# Patient Record
Sex: Female | Born: 1980 | Race: Black or African American | Hispanic: No | Marital: Single | Smoking: Never smoker
Health system: Southern US, Community
[De-identification: ages and names within clinical notes are randomized; demographics above are authoritative.]

## PROBLEM LIST (undated history)

## (undated) DIAGNOSIS — A599 Trichomoniasis, unspecified: Secondary | ICD-10-CM

## (undated) DIAGNOSIS — I10 Essential (primary) hypertension: Secondary | ICD-10-CM

## (undated) DIAGNOSIS — E119 Type 2 diabetes mellitus without complications: Secondary | ICD-10-CM

## (undated) DIAGNOSIS — T7840XA Allergy, unspecified, initial encounter: Secondary | ICD-10-CM

## (undated) HISTORY — DX: Essential (primary) hypertension: I10

## (undated) HISTORY — DX: Type 2 diabetes mellitus without complications: E11.9

## (undated) HISTORY — DX: Allergy, unspecified, initial encounter: T78.40XA

## (undated) HISTORY — DX: Trichomoniasis, unspecified: A59.9

---

## 2015-08-12 ENCOUNTER — Ambulatory Visit: Payer: Self-pay | Admitting: "Endocrinology

## 2017-10-28 ENCOUNTER — Telehealth: Payer: Self-pay | Admitting: Family Medicine

## 2017-10-28 ENCOUNTER — Ambulatory Visit: Payer: Self-pay | Admitting: Family Medicine

## 2017-10-30 NOTE — Telephone Encounter (Signed)
Message was left for patient on appointment day but there has been no response.  Note filed.

## 2017-11-05 NOTE — Progress Notes (Signed)
Subjective: YQ:[id number] care, HTN, DM2, abnormal uterine bleeding HPI: Melissa Kelley is a 37 y.o. female presenting to clinic today for:  1. Hypertension Patient is that she was diagnosed around 2014.  Meds: Compliant with triamterene/HCTZ, Side effects: Increased urination. Denies headache, dizziness, visual changes, nausea, vomiting, chest pain, LE swelling, abdominal pain or shortness of breath.  2. Type 2 Diabetes:  She reports that she was diagnosed in April 2014.  Taking medication(s): Metformin 1000 mg p.o. twice daily, glipizide XL 10 mg (but notes that she was actually prescribed 5 mg most recently), Side effects: She does have some GI upset with metformin despite consistent use.  Last eye exam: About 1 year ago.  She sees Dr. Truman Hayward at [facility].  No history of retinopathy. Last foot exam: About 1 year ago.  No history of ulcers or peripheral neuropathy. Last A1c: About 1 year ago.  Unsure as to what the last A1c value was. Nephropathy screen indicated?:  Yes.  Not currently on ACE-I or ARB. Last flu, zoster and/or pneumovax: Unknown.  Awaiting previous PCPs records.  ROS: denies fever, chills, dizziness, LOC, polyuria, polydipsia, unintended weight loss/gain, foot ulcerations, numbness or tingling in extremities or chest pain.  3.  Abnormal uterine bleeding Patient notes that she has had a long-standing history of abnormal uterine bleeding.  She reports heavy menstrual cycles with clotting and discomfort.  She notes that between.  She will actually have pelvic pain.  She is concerned that she may have endometriosis or fibroid.  No family history of endometriosis or fibroid.  She would like to have this further evaluated.  She is not currently on any contraception.  She has 2 children.  Last pap ~3 years ago.  No abnormals.    Past Medical History:  Diagnosis Date  . Allergy   . Diabetes mellitus without complication ([place])   . Hypertension    History reviewed. No pertinent  surgical history. Social History   Socioeconomic History  . Marital status: Single    Spouse name: Not on file  . Number of children: Not on file  . Years of education: Not on file  . Highest education level: Not on file  Occupational History  . Not on file  Social Needs  . Financial resource strain: Not on file  . Food insecurity:    Worry: Not on file    Inability: Not on file  . Transportation needs:    Medical: Not on file    Non-medical: Not on file  Tobacco Use  . Smoking status: Never Smoker  Substance and Sexual Activity  . Alcohol use: Never    Frequency: Never  . Drug use: Never  . Sexual activity: Not on file  Lifestyle  . Physical activity:    Days per week: Not on file    Minutes per session: Not on file  . Stress: Not on file  Relationships  . Social connections:    Talks on phone: Not on file    Gets together: Not on file    Attends religious service: Not on file    Active member of club or organization: Not on file    Attends meetings of clubs or organizations: Not on file    Relationship status: Not on file  . Intimate partner violence:    Fear of current or ex partner: Not on file    Emotionally abused: Not on file    Physically abused: Not on file    Forced sexual activity: Not  on file  Other Topics Concern  . Not on file  Social History Narrative  . Not on file   Current Meds  Medication Sig  . glipiZIDE (GLUCOTROL) 5 MG tablet Take by mouth daily before breakfast.  . metFORMIN (GLUCOPHAGE) 1000 MG tablet Take 1,000 mg by mouth 2 (two) times daily with a meal.  . triamterene-hydrochlorothiazide (DYAZIDE) 37.5-25 MG capsule Take 1 capsule by mouth daily.   Family History  Problem Relation Age of Onset  . Diabetes Father    No Known Allergies  ROS: Per HPI  Objective: Office vital signs reviewed. BP 127/86   Pulse (!) 104   Temp 97.6 F (36.4 C) (Oral)   Ht '5\' 3"'  (1.6 m)   Wt 208 lb (94.3 kg)   BMI 36.85 kg/m   Physical  Examination:  General: Awake, alert, obese, No acute distress HEENT: Normal    Neck: No goiter.    Eyes: PERRLA, extraocular movement in tact, sclera white    Throat: moist mucus membranes Cardio: regular rate and rhythm, S1S2 heard, no murmurs appreciated Pulm: clear to auscultation bilaterally, no wheezes, rhonchi or rales; normal work of breathing on room air GI: Central adiposity noted. Extremities: warm, well perfused, No edema, cyanosis or clubbing; +2 pulses bilaterally MSK: normal gait and normal station Skin: dry; intact; no rashes or lesions Neuro: No focal neurologic deficits.  See monofilament exam as below.  Diabetic Foot Form - Detailed   Diabetic Foot Exam - detailed Diabetic Foot exam was performed with the following findings:  Yes 11/06/2017  4:04 PM  Visual Foot Exam completed.:  Yes  Can the patient see the bottom of their feet?:  Yes Are the shoes appropriate in style and fit?:  Yes Is there swelling or and abnormal foot shape?:  No Is there a claw toe deformity?:  Yes Is there elevated skin temparature?:  No Is there foot or ankle muscle weakness?:  No Normal Range of Motion:  Yes Pulse Foot Exam completed.:  Yes  Right posterior Tibialias:  Present Left posterior Tibialias:  Present  Right Dorsalis Pedis:  Present Left Dorsalis Pedis:  Present  Sensory Foot Exam Completed.:  Yes Semmes-Weinstein Monofilament Test R Site 1-Great Toe:  Pos L Site 1-Great Toe:  Pos       Assessment/ Plan: 37 y.o. female   1. Diabetes mellitus without complication ([place]) We will plan to refill diabetic medications pending A1c result today.  Check A1c, urine microalbumin/creatinine, CMP, lipid panel.  Plan to start Lipitor 1m.  We discussed Livalo today as an alternative as well.  Will plan to consider Victoza/ Trulicity as well. - Microalbumin / creatinine urine ratio - CMP14+EGFR - Bayer DCA Hb A1c Waived - Lipid Panel  2. Establishing care with new doctor, encounter  for ROI completed.  Will obtain records from Dr BIrene Shipperoffice  3. Hypertension associated with diabetes ([place].  Medication refilled.   - CMP14+EGFR  4. Morbid obesity (HCC) - TSH  5. Abnormal uterine bleeding (AUB) May need to refer to GYN.  Labs and imaging as below. - CBC with Differential - TSH - [place]elvis Complete; Future - [place]Transvaginal Non-OB; Future   Ashly MWindell Moulding DMcKinnon([phone]

## 2017-11-06 ENCOUNTER — Encounter: Payer: Self-pay | Admitting: Family Medicine

## 2017-11-06 ENCOUNTER — Ambulatory Visit (INDEPENDENT_AMBULATORY_CARE_PROVIDER_SITE_OTHER): Payer: Managed Care, Other (non HMO) | Admitting: Family Medicine

## 2017-11-06 VITALS — BP 127/86 | HR 104 | Temp 97.6°F | Ht 63.0 in | Wt 208.0 lb

## 2017-11-06 DIAGNOSIS — I10 Essential (primary) hypertension: Secondary | ICD-10-CM | POA: Diagnosis not present

## 2017-11-06 DIAGNOSIS — E1159 Type 2 diabetes mellitus with other circulatory complications: Secondary | ICD-10-CM | POA: Diagnosis not present

## 2017-11-06 DIAGNOSIS — E119 Type 2 diabetes mellitus without complications: Secondary | ICD-10-CM | POA: Diagnosis not present

## 2017-11-06 DIAGNOSIS — N939 Abnormal uterine and vaginal bleeding, unspecified: Secondary | ICD-10-CM

## 2017-11-06 DIAGNOSIS — Z7689 Persons encountering health services in other specified circumstances: Secondary | ICD-10-CM

## 2017-11-06 DIAGNOSIS — I152 Hypertension secondary to endocrine disorders: Secondary | ICD-10-CM | POA: Insufficient documentation

## 2017-11-06 DIAGNOSIS — Z7985 Long-term (current) use of injectable non-insulin antidiabetic drugs: Secondary | ICD-10-CM | POA: Insufficient documentation

## 2017-11-06 LAB — BAYER DCA HB A1C WAIVED: HB A1C (BAYER DCA - WAIVED): 11 % — ABNORMAL HIGH (ref <7.0)

## 2017-11-06 NOTE — Patient Instructions (Signed)
You had labs performed today.  You will be contacted with the results of the labs once they are available, usually in the next 3 days for routine lab work.  I have ordered a pelvic ultrasound.  You will be contacted to schedule this.   Abnormal Uterine Bleeding Abnormal uterine bleeding means bleeding more than usual from your uterus. It can include:  Bleeding between periods.  Bleeding after sex.  Bleeding that is heavier than normal.  Periods that last longer than usual.  Bleeding after you have stopped having your period (menopause).  There are many problems that may cause this. You should see a doctor for any kind of bleeding that is not normal. Treatment depends on the cause of the bleeding. Follow these instructions at home:  Watch your condition for any changes.  Do not use tampons, douche, or have sex, if your doctor tells you not to.  Change your pads often.  Get regular well-woman exams. Make sure they include a pelvic exam and cervical cancer screening.  Keep all follow-up visits as told by your doctor. This is important. Contact a doctor if:  The bleeding lasts more than one week.  You feel dizzy at times.  You feel like you are going to throw up (nauseous).  You throw up. Get help right away if:  You pass out.  You have to change pads every hour.  You have belly (abdominal) pain.  You have a fever.  You get sweaty.  You get weak.  You passing large blood clots from your vagina. Summary  Abnormal uterine bleeding means bleeding more than usual from your uterus.  There are many problems that may cause this. You should see a doctor for any kind of bleeding that is not normal.  Treatment depends on the cause of the bleeding. This information is not intended to replace advice given to you by your health care provider. Make sure you discuss any questions you have with your health care provider. Document Released: 04/29/2009 Document Revised:  06/26/2016 Document Reviewed: 06/26/2016 Elsevier Interactive Patient Education  2017 [employer]

## 2017-11-07 ENCOUNTER — Other Ambulatory Visit: Payer: Self-pay | Admitting: Family Medicine

## 2017-11-07 LAB — CMP14+EGFR
A/G RATIO: 1.3 (ref 1.2–2.2)
ALBUMIN: 4.3 g/dL (ref 3.5–5.5)
ALK PHOS: 85 IU/L (ref 39–117)
ALT: 21 IU/L (ref 0–32)
AST: 18 IU/L (ref 0–40)
BUN / CREAT RATIO: 12 (ref 9–23)
BUN: 9 mg/dL (ref 6–20)
Bilirubin Total: 0.3 mg/dL (ref 0.0–1.2)
CO2: 24 mmol/L (ref 20–29)
CREATININE: 0.74 mg/dL (ref 0.57–1.00)
Calcium: 9.7 mg/dL (ref 8.7–10.2)
Chloride: 95 mmol/L — ABNORMAL LOW (ref 96–106)
GFR calc Af Amer: 121 mL/min/{1.73_m2} (ref >59)
GFR calc non Af Amer: 105 mL/min/{1.73_m2} (ref >59)
GLOBULIN, TOTAL: 3.2 g/dL (ref 1.5–4.5)
Glucose: 302 mg/dL — ABNORMAL HIGH (ref 65–99)
POTASSIUM: 3.6 mmol/L (ref 3.5–5.2)
SODIUM: 135 mmol/L (ref 134–144)
Total Protein: 7.5 g/dL (ref 6.0–8.5)

## 2017-11-07 LAB — LIPID PANEL
CHOL/HDL RATIO: 3.6 ratio (ref 0.0–4.4)
Cholesterol, Total: 182 mg/dL (ref 100–199)
HDL: 50 mg/dL (ref >39)
LDL CALC: 111 mg/dL — AB (ref 0–99)
Triglycerides: 107 mg/dL (ref 0–149)
VLDL Cholesterol Cal: 21 mg/dL (ref 5–40)

## 2017-11-07 LAB — CBC WITH DIFFERENTIAL/PLATELET
Basophils Absolute: 0 10*3/uL (ref 0.0–0.2)
Basos: 0 %
EOS (ABSOLUTE): 0.1 10*3/uL (ref 0.0–0.4)
EOS: 1 %
HEMATOCRIT: 39 % (ref 34.0–46.6)
Hemoglobin: 12.9 g/dL (ref 11.1–15.9)
Immature Grans (Abs): 0 10*3/uL (ref 0.0–0.1)
Immature Granulocytes: 0 %
LYMPHS ABS: 3.5 10*3/uL — AB (ref 0.7–3.1)
Lymphs: 43 %
MCH: 30.1 pg (ref 26.6–33.0)
MCHC: 33.1 g/dL (ref 31.5–35.7)
MCV: 91 fL (ref 79–97)
MONOS ABS: 0.4 10*3/uL (ref 0.1–0.9)
Monocytes: 5 %
Neutrophils Absolute: 4.3 10*3/uL (ref 1.4–7.0)
Neutrophils: 51 %
Platelets: 339 10*3/uL (ref 150–379)
RBC: 4.29 x10E6/uL (ref 3.77–5.28)
RDW: 14.3 % (ref 12.3–15.4)
WBC: 8.3 10*3/uL (ref 3.4–10.8)

## 2017-11-07 LAB — MICROALBUMIN / CREATININE URINE RATIO
Creatinine, Urine: 55.5 mg/dL
Microalb/Creat Ratio: 6.7 mg/g creat (ref 0.0–30.0)
Microalbumin, Urine: 3.7 ug/mL

## 2017-11-07 LAB — TSH: TSH: 1.18 u[IU]/mL (ref 0.450–4.500)

## 2017-11-07 MED ORDER — TRIAMTERENE-HCTZ 37.5-25 MG PO CAPS: 1.0000 | ORAL_CAPSULE | Freq: Every day | ORAL | 12 refills | Status: DC

## 2017-11-07 MED ORDER — METFORMIN HCL 1000 MG PO TABS: 1000.0000 mg | ORAL_TABLET | Freq: Two times a day (BID) | ORAL | 12 refills | Status: DC

## 2017-11-07 MED ORDER — DULAGLUTIDE 0.75 MG/0.5ML ~~LOC~~ SOAJ: 0.7500 mg | SUBCUTANEOUS | 0 refills | Status: DC

## 2017-11-07 MED ORDER — ATORVASTATIN CALCIUM 20 MG PO TABS: 20.0000 mg | ORAL_TABLET | Freq: Every day | ORAL | 12 refills | Status: DC

## 2017-11-07 NOTE — Progress Notes (Signed)
I reviewed patient's A1c result.  We discussed options for BG control.  Will proceed with Trulicity since Metformin/ Glipizide is not keeping patient at goal.  She denies personal history of pancreatitis, MEN tumor, thyroid cancer.  Renal function reviewed & WNL.  Discontinue Glipizide.  Continue metformin 1000mg  po BID.  Start Trulicity 0.75mg  sub q per week.  Sample provided.  Patient to call in 2 weeks to let me know how she is doing on this dose.  Can plan to increase dose pending tolerance.    Meds ordered this encounter  Medications  . Dulaglutide (TRULICITY) 0.75 MG/0.5ML SOPN    Sig: Inject 0.75 mg into the skin every 7 (seven) days.    Dispense:  2 pen    Refill:  0  . metFORMIN (GLUCOPHAGE) 1000 MG tablet    Sig: Take 1 tablet (1,000 mg total) by mouth 2 (two) times daily with a meal.    Dispense:  60 tablet    Refill:  12  . triamterene-hydrochlorothiazide (DYAZIDE) 37.5-25 MG capsule    Sig: Take 1 each (1 capsule total) by mouth daily.    Dispense:  30 capsule    Refill:  12  . atorvastatin (LIPITOR) 20 MG tablet    Sig: Take 1 tablet (20 mg total) by mouth daily.    Dispense:  30 tablet    Refill:  12   Avynn Klassen M. Nadine CountsGottschalk, DO [facility]

## 2017-11-14 ENCOUNTER — Ambulatory Visit (HOSPITAL_COMMUNITY)
Admission: RE | Admit: 2017-11-14 | Discharge: 2017-11-14 | Disposition: A | Discharge status: Home / Self Care | Payer: Managed Care, Other (non HMO) | Source: Ambulatory Visit | Attending: Family Medicine | Admitting: Family Medicine

## 2017-11-14 ENCOUNTER — Telehealth: Payer: Self-pay | Admitting: Family Medicine

## 2017-11-14 ENCOUNTER — Other Ambulatory Visit: Payer: Self-pay | Admitting: Family Medicine

## 2017-11-14 DIAGNOSIS — N939 Abnormal uterine and vaginal bleeding, unspecified: Secondary | ICD-10-CM | POA: Insufficient documentation

## 2017-11-14 MED ORDER — DULAGLUTIDE 0.75 MG/0.5ML ~~LOC~~ SOAJ: 0.7500 mg | SUBCUTANEOUS | 2 refills | Status: DC

## 2017-11-14 NOTE — Telephone Encounter (Signed)
Please advise 

## 2017-11-14 NOTE — Telephone Encounter (Signed)
Spoke to patient.  She felt ill the first day that she was on the medication, citing that she had abdominal pain and nausea.  Since that time, she has been doing very well.  Blood sugars running 80-170.  No residual abdominal pain, nausea or vomiting.  She took her second dose today and is doing well.  We will continue with the 0.75 mg/week dosing for now.  We discussed that there is an option to increase the dose if she would like to.  We will reevaluate her A1c in about 3 months and increased dose pending this.

## 2017-11-18 ENCOUNTER — Other Ambulatory Visit: Payer: Self-pay | Admitting: Family Medicine

## 2017-11-18 DIAGNOSIS — N939 Abnormal uterine and vaginal bleeding, unspecified: Secondary | ICD-10-CM

## 2017-12-03 ENCOUNTER — Ambulatory Visit (INDEPENDENT_AMBULATORY_CARE_PROVIDER_SITE_OTHER): Payer: Managed Care, Other (non HMO) | Admitting: Adult Health

## 2017-12-03 ENCOUNTER — Encounter: Payer: Self-pay | Admitting: Adult Health

## 2017-12-03 VITALS — BP 128/80 | HR 98 | Ht 63.0 in | Wt 204.4 lb

## 2017-12-03 DIAGNOSIS — N921 Excessive and frequent menstruation with irregular cycle: Secondary | ICD-10-CM

## 2017-12-03 DIAGNOSIS — R109 Unspecified abdominal pain: Secondary | ICD-10-CM

## 2017-12-03 NOTE — Progress Notes (Signed)
  Subjective:     Patient ID: Melissa Kelley, female   DOB: [birth year], 37 y.o.   MRN: [id number]  HPI Melissa Kelley is a 37 year old black female in for Bleeding issues. PCP is Dr Nadine Counts.   Review of Systems +BTB periods heavy 2/5 days, changes every 2 hours +cramps Reviewed past medical,surgical, social and family history. Reviewed medications and allergies.     Objective:   Physical Exam BP 128/80 (BP Location: Left Arm, Patient Position: Sitting, Cuff Size: Small)   Pulse 98   Ht  (1.6 m)   Wt 204 lb 6.4 oz (92.7 kg)   LMP 11/17/2017 (Exact Date)   BMI 36.21 kg/m   Skin warm and dry. Neck: mid line trachea, normal thyroid, good ROM, no lymphadenopathy noted. Lungs: clear to ausculation bilaterally. Cardiovascular: regular rate and rhythm.Reviewed [place] she had done 5/2, showed EEC 15 mm, other wise normal.Will recheck [place] after next period.She declines meds to manipulate period, is ok if gets pregnant.     Assessment:     1. Menorrhagia with irregular cycle   2. Abdominal cramps       Plan:    Return 6/6 for F/U US Then see me about a week later for pap and physical Review handout on menorrhagia  Encouraged her to get blood sugars under control

## 2017-12-03 NOTE — Patient Instructions (Signed)

## 2017-12-12 ENCOUNTER — Telehealth: Payer: Self-pay | Admitting: Family Medicine

## 2017-12-12 NOTE — Telephone Encounter (Signed)
Pt aware of recommendations

## 2017-12-12 NOTE — Telephone Encounter (Signed)
We discussed her dosing on 5/2.  We will increase her dose pending her A1c in 2 months.  The higher dose of trulicity doesn't give a ton of advantages but does come with increased side effects.  I sent in refills of the 0.75mg  Trulicity already.  This should be available to her her at her Pharmacy

## 2017-12-19 ENCOUNTER — Other Ambulatory Visit: Payer: Managed Care, Other (non HMO)

## 2018-01-01 ENCOUNTER — Encounter: Payer: Self-pay | Admitting: Adult Health

## 2018-01-01 ENCOUNTER — Encounter (INDEPENDENT_AMBULATORY_CARE_PROVIDER_SITE_OTHER): Payer: Self-pay

## 2018-01-01 ENCOUNTER — Ambulatory Visit (INDEPENDENT_AMBULATORY_CARE_PROVIDER_SITE_OTHER): Payer: Managed Care, Other (non HMO) | Admitting: Adult Health

## 2018-01-01 ENCOUNTER — Other Ambulatory Visit (HOSPITAL_COMMUNITY)
Admission: RE | Admit: 2018-01-01 | Discharge: 2018-01-01 | Disposition: A | Discharge status: Home / Self Care | Payer: Managed Care, Other (non HMO) | Source: Ambulatory Visit | Attending: Adult Health | Admitting: Adult Health

## 2018-01-01 VITALS — BP 115/80 | HR 109 | Ht 62.25 in | Wt 197.5 lb

## 2018-01-01 DIAGNOSIS — Z01411 Encounter for gynecological examination (general) (routine) with abnormal findings: Secondary | ICD-10-CM

## 2018-01-01 DIAGNOSIS — N921 Excessive and frequent menstruation with irregular cycle: Secondary | ICD-10-CM | POA: Diagnosis not present

## 2018-01-01 DIAGNOSIS — Z01419 Encounter for gynecological examination (general) (routine) without abnormal findings: Secondary | ICD-10-CM | POA: Insufficient documentation

## 2018-01-01 NOTE — Progress Notes (Signed)
Patient ID: Melissa Kelley, female   DOB: 05/22/1981, 37 y.o.   MRN: [id number] History of Present Illness: Gabriela Evesikkiya is a 37 year old black female, single, G2P2 in for well woman gyn exam and pap. PCP is Dr Nadine CountsGottschalk.   Current Medications, Allergies, Past Medical History, Past Surgical History, Family History and Social History were reviewed in [facility] electronic medical record.     Review of Systems: Patient denies any headaches, hearing loss, fatigue, blurred vision, shortness of breath, chest pain, abdominal pain, problems with bowel movements, urination, or intercourse. No joint pain or mood swings. Having heavy periods and cramps at times, had had US in May with endometrial  Thickness of 15 mm, and did not get F/U US after last period.    Physical Exam:BP 115/80 (BP Location: Left Arm, Patient Position: Sitting, Cuff Size: Normal)   Pulse (!) 109   Ht 5' 2.25" (1.581 m)   Wt 197 lb 8 oz (89.6 kg)   LMP 12/13/2017   BMI 35.83 kg/m  General:  Well developed, well nourished, no acute distress Skin:  Warm and dry Neck:  Midline trachea, normal thyroid, good ROM, no lymphadenopathy Lungs; Clear to auscultation bilaterally Breast:  No dominant palpable mass, retraction, or nipple discharge, has large breasts Cardiovascular: Regular rate and rhythm Abdomen:  Soft, non tender, no hepatosplenomegaly Pelvic:  External genitalia is normal in appearance, no lesions.  The vagina is normal in appearance. Urethra has no lesions or masses. The cervix is bulbous. Pap with HPV and GC/CHL performed.  Uterus is felt to be normal size, shape, and contour.  No adnexal masses or tenderness noted.Bladder is non tender, no masses felt. Extremities/musculoskeletal:  No swelling or varicosities noted, no clubbing or cyanosis Psych:  No mood changes, alert and cooperative,seems happy PHQ 2 score 0.  Impression: 1. Encounter for gynecological examination with Papanicolaou smear of cervix   2.  Menorrhagia with irregular cycle       Plan: Will reschedule F/U US in about 3 weeks Physical in 1 year Pap in 3 if normal Mammogram at 40  Labs with PCP

## 2018-01-03 LAB — CYTOLOGY - PAP
Chlamydia: NEGATIVE
Diagnosis: NEGATIVE
HPV (WINDOPATH): NOT DETECTED
NEISSERIA GONORRHEA: NEGATIVE

## 2018-01-06 ENCOUNTER — Telehealth: Payer: Self-pay | Admitting: Adult Health

## 2018-01-06 ENCOUNTER — Encounter: Payer: Self-pay | Admitting: Adult Health

## 2018-01-06 DIAGNOSIS — A599 Trichomoniasis, unspecified: Secondary | ICD-10-CM | POA: Insufficient documentation

## 2018-01-06 HISTORY — DX: Trichomoniasis, unspecified: A59.9

## 2018-01-06 MED ORDER — METRONIDAZOLE 500 MG PO TABS: 500.0000 mg | ORAL_TABLET | Freq: Three times a day (TID) | ORAL | 0 refills | Status: DC

## 2018-01-06 NOTE — Telephone Encounter (Signed)
Pt aware that pap negative for malignancy and HPV and GC/CHL but + for trich, rx flagyl, tell partner to get treated, no sex and POC 7/5 at 1 pm

## 2018-01-17 ENCOUNTER — Ambulatory Visit (INDEPENDENT_AMBULATORY_CARE_PROVIDER_SITE_OTHER): Payer: Managed Care, Other (non HMO) | Admitting: Adult Health

## 2018-01-17 ENCOUNTER — Other Ambulatory Visit: Payer: Self-pay

## 2018-01-17 ENCOUNTER — Encounter: Payer: Self-pay | Admitting: Adult Health

## 2018-01-17 VITALS — BP 132/90 | HR 97 | Ht 63.0 in | Wt 199.0 lb

## 2018-01-17 DIAGNOSIS — Z8619 Personal history of other infectious and parasitic diseases: Secondary | ICD-10-CM | POA: Diagnosis not present

## 2018-01-17 DIAGNOSIS — Z09 Encounter for follow-up examination after completed treatment for conditions other than malignant neoplasm: Secondary | ICD-10-CM | POA: Diagnosis not present

## 2018-01-17 LAB — POCT WET PREP (WET MOUNT)
Clue Cells Wet Prep Whiff POC: NEGATIVE
TRICHOMONAS WET PREP HPF POC: ABSENT

## 2018-01-17 NOTE — Progress Notes (Signed)
  Subjective:     Patient ID: Melissa Kelley, female   DOB: 10/07/1980, 37 y.o.   MRN: [id number]  HPI Melissa Kelley is a 37 year old black female in for POC for trich on recent pap,and partner was treated.   Review of Systems  No discharge or odor Reviewed past medical,surgical, social and family history. Reviewed medications and allergies.     Objective:   Physical Exam BP 132/90 (BP Location: Right Arm, Patient Position: Sitting, Cuff Size: Normal)   Pulse 97   Ht 5\' 3"  (1.6 m)   Wt 199 lb (90.3 kg)   LMP 01/08/2018   BMI 35.25 kg/m   Skin warm and dry.Pelvic: external genitalia is normal in appearance no lesions, vagina: scant white discharge without odor,urethra has no lesions or masses noted, cervix:smooth and bulbous, uterus: normal size, shape and contour, non tender, no masses felt, adnexa: no masses or tenderness noted. Bladder is non tender and no masses felt. Wet prep: + few WBC    Assessment:      History of trich on pap, for POC    Plan:     F/U prn

## 2018-01-23 ENCOUNTER — Ambulatory Visit (INDEPENDENT_AMBULATORY_CARE_PROVIDER_SITE_OTHER): Payer: Managed Care, Other (non HMO)

## 2018-01-23 DIAGNOSIS — N921 Excessive and frequent menstruation with irregular cycle: Secondary | ICD-10-CM

## 2018-01-23 DIAGNOSIS — R109 Unspecified abdominal pain: Secondary | ICD-10-CM | POA: Diagnosis not present

## 2018-01-23 NOTE — Progress Notes (Signed)
PELVIC US TA/TV:homogeneous retroflexed uterus,wnl,EEC 15 mm,small amount of fluid w/in endometrium,normal ovaries bilat,small amount of simple cul de sac fluid,ovaries appear mobile,left adnexal pain during ultrasound

## 2018-01-24 ENCOUNTER — Telehealth: Payer: Self-pay | Admitting: Adult Health

## 2018-01-24 NOTE — Telephone Encounter (Signed)
Pt aware that uterus is normal and ovaries are normal and that EEC 15 mm, will follow for now, she declines OCs.

## 2018-02-06 ENCOUNTER — Telehealth: Payer: Self-pay | Admitting: *Deleted

## 2018-02-06 NOTE — Telephone Encounter (Signed)
Pt called regarding results of u/s done on 01/23/18. DOB verified. She states that she has been waiting on a doctor to review the report. Informed pt that Dr Alyssa GroveEures note states that the uterus and ovaries are normal. Pt verbalized understanding.

## 2018-02-07 ENCOUNTER — Telehealth: Payer: Self-pay | Admitting: *Deleted

## 2018-02-07 NOTE — Telephone Encounter (Signed)
Patient called in complaining with chest pain radiating to her arm. Patient is not having SOB. Patient advised she needs to be evaluated by the ED.

## 2018-02-10 ENCOUNTER — Ambulatory Visit (INDEPENDENT_AMBULATORY_CARE_PROVIDER_SITE_OTHER): Payer: Managed Care, Other (non HMO) | Admitting: Family Medicine

## 2018-02-10 ENCOUNTER — Encounter: Payer: Self-pay | Admitting: Family Medicine

## 2018-02-10 VITALS — BP 126/86 | HR 107 | Temp 97.2°F | Ht 63.0 in | Wt 195.0 lb

## 2018-02-10 DIAGNOSIS — E1169 Type 2 diabetes mellitus with other specified complication: Secondary | ICD-10-CM | POA: Diagnosis not present

## 2018-02-10 DIAGNOSIS — E119 Type 2 diabetes mellitus without complications: Secondary | ICD-10-CM | POA: Diagnosis not present

## 2018-02-10 DIAGNOSIS — E785 Hyperlipidemia, unspecified: Secondary | ICD-10-CM

## 2018-02-10 DIAGNOSIS — E1159 Type 2 diabetes mellitus with other circulatory complications: Secondary | ICD-10-CM

## 2018-02-10 DIAGNOSIS — I10 Essential (primary) hypertension: Secondary | ICD-10-CM

## 2018-02-10 DIAGNOSIS — I152 Hypertension secondary to endocrine disorders: Secondary | ICD-10-CM

## 2018-02-10 LAB — BAYER DCA HB A1C WAIVED: HB A1C (BAYER DCA - WAIVED): 6.8 % (ref <7.0)

## 2018-02-10 MED ORDER — SITAGLIPTIN PHOSPHATE 100 MG PO TABS: 100.0000 mg | ORAL_TABLET | Freq: Every day | ORAL | 2 refills | Status: DC

## 2018-02-10 NOTE — Patient Instructions (Addendum)
Schedule your diabetic eye exam w/ Dr Conley RollsLe.  Continue the atorvastatin for your cholesterol.  Continue Triamterene/HCTZ for your blood pressure.  It was recommended that you have the Pneumonia and Tetanus vaccines today but you declined.  Your diabetes causes decreased immunity.  Please feel free to contact our office any time to have these life saving vaccines administered.  Your A1c was well controlled today.  Continue the Metformin.  We have discontinued the Trulicty because of side effects.  I have prescribed you Januvia to start.  Start this instead of your next Trulicity dose on Tuesday.  This is available in a combination pill.  When you are getting done with the Metformin, call me and I can send in the combo pill.  Follow up in 3 months for diabetes or sooner if needed.   Diabetes Mellitus and Nutrition When you have diabetes (diabetes mellitus), it is very important to have healthy eating habits because your blood sugar (glucose) levels are greatly affected by what you eat and drink. Eating healthy foods in the appropriate amounts, at about the same times every day, can help you:  Control your blood glucose.  Lower your risk of heart disease.  Improve your blood pressure.  Reach or maintain a healthy weight.  Every person with diabetes is different, and each person has different needs for a meal plan. Your health care provider may recommend that you work with a diet and nutrition specialist (dietitian) to make a meal plan that is best for you. Your meal plan may vary depending on factors such as:  The calories you need.  The medicines you take.  Your weight.  Your blood glucose, blood pressure, and cholesterol levels.  Your activity level.  Other health conditions you have, such as heart or kidney disease.  How do carbohydrates affect me? Carbohydrates affect your blood glucose level more than any other type of food. Eating carbohydrates naturally increases the amount of  glucose in your blood. Carbohydrate counting is a method for keeping track of how many carbohydrates you eat. Counting carbohydrates is important to keep your blood glucose at a healthy level, especially if you use insulin or take certain oral diabetes medicines. It is important to know how many carbohydrates you can safely have in each meal. This is different for every person. Your dietitian can help you calculate how many carbohydrates you should have at each meal and for snack. Foods that contain carbohydrates include:  Bread, cereal, rice, pasta, and crackers.  Potatoes and corn.  Peas, beans, and lentils.  Milk and yogurt.  Fruit and juice.  Desserts, such as cakes, cookies, ice cream, and candy.  How does alcohol affect me? Alcohol can cause a sudden decrease in blood glucose (hypoglycemia), especially if you use insulin or take certain oral diabetes medicines. Hypoglycemia can be a life-threatening condition. Symptoms of hypoglycemia (sleepiness, dizziness, and confusion) are similar to symptoms of having too much alcohol. If your health care provider says that alcohol is safe for you, follow these guidelines:  Limit alcohol intake to no more than 1 drink per day for nonpregnant women and 2 drinks per day for men. One drink equals 12 oz of beer, 5 oz of wine, or 1 oz of hard liquor.  Do not drink on an empty stomach.  Keep yourself hydrated with water, diet soda, or unsweetened iced tea.  Keep in mind that regular soda, juice, and other mixers may contain a lot of sugar and must be counted as  carbohydrates.  What are tips for following this plan? Reading food labels  Start by checking the serving size on the label. The amount of calories, carbohydrates, fats, and other nutrients listed on the label are based on one serving of the food. Many foods contain more than one serving per package.  Check the total grams (g) of carbohydrates in one serving. You can calculate the number  of servings of carbohydrates in one serving by dividing the total carbohydrates by 15. For example, if a food has 30 g of total carbohydrates, it would be equal to 2 servings of carbohydrates.  Check the number of grams (g) of saturated and trans fats in one serving. Choose foods that have low or no amount of these fats.  Check the number of milligrams (mg) of sodium in one serving. Most people should limit total sodium intake to less than 2,300 mg per day.  Always check the nutrition information of foods labeled as "low-fat" or "nonfat". These foods may be higher in added sugar or refined carbohydrates and should be avoided.  Talk to your dietitian to identify your daily goals for nutrients listed on the label. Shopping  Avoid buying canned, premade, or processed foods. These foods tend to be high in fat, sodium, and added sugar.  Shop around the outside edge of the grocery store. This includes fresh fruits and vegetables, bulk grains, fresh meats, and fresh dairy. Cooking  Use low-heat cooking methods, such as baking, instead of high-heat cooking methods like deep frying.  Cook using healthy oils, such as olive, canola, or sunflower oil.  Avoid cooking with butter, cream, or high-fat meats. Meal planning  Eat meals and snacks regularly, preferably at the same times every day. Avoid going long periods of time without eating.  Eat foods high in fiber, such as fresh fruits, vegetables, beans, and whole grains. Talk to your dietitian about how many servings of carbohydrates you can eat at each meal.  Eat 4-6 ounces of lean protein each day, such as lean meat, chicken, fish, eggs, or tofu. 1 ounce is equal to 1 ounce of meat, chicken, or fish, 1 egg, or 1/4 cup of tofu.  Eat some foods each day that contain healthy fats, such as avocado, nuts, seeds, and fish. Lifestyle   Check your blood glucose regularly.  Exercise at least 30 minutes 5 or more days each week, or as told by your  health care provider.  Take medicines as told by your health care provider.  Do not use any products that contain nicotine or tobacco, such as cigarettes and e-cigarettes. If you need help quitting, ask your health care provider.  Work with a Veterinary surgeon or diabetes educator to identify strategies to manage stress and any emotional and social challenges. What are some questions to ask my health care provider?  Do I need to meet with a diabetes educator?  Do I need to meet with a dietitian?  What number can I call if I have questions?  When are the best times to check my blood glucose? Where to find more information:  American Diabetes Association: diabetes.org/food-and-fitness/food  Academy of Nutrition and Dietetics: https://www.vargas.com/  [employer] of Diabetes and Digestive and Kidney Diseases (NIH): FindJewelers.cz Summary  A healthy meal plan will help you control your blood glucose and maintain a healthy lifestyle.  Working with a diet and nutrition specialist (dietitian) can help you make a meal plan that is best for you.  Keep in mind that carbohydrates and alcohol  have immediate effects on your blood glucose levels. It is important to count carbohydrates and to use alcohol carefully. This information is not intended to replace advice given to you by your health care provider. Make sure you discuss any questions you have with your health care provider. Document Released: 03/29/2005 Document Revised: 08/06/2016 Document Reviewed: 08/06/2016 Elsevier Interactive Patient Education  [employer].

## 2018-02-10 NOTE — Progress Notes (Signed)
Subjective: CC: F/u DM2 HPI: Melissa Kelley is a 37 y.o. female presenting to clinic today for:  1.  Type 2 Diabetes/ HLD/ HTN:  History: Dx 2014. [facility]  No hx retinopathy or DM foot ulcers.  Patient was started on Trulicity 0.75mg  sub-q/ week at last visit.  She reports compliance w. Metformin.  She was to discontinue the Glipizide.  Lipitor 20mg  daily also added.  Compliant w/ Dyazide.  She reports significant side effects from Trulicity.  She notes nausea with vomiting several times per week following meals.  She reports decreased food intake secondary to nausea.  She notes several hypoglycemic episodes into the 50s and 60s.  Blood sugar high has been no more than 150.  She has had a leave work because of side effects a few times now.  [facility] No retinopathy. Last foot exam: 11/06/2017 Last A1c: 11.0 on 11/06/2017 Nephropathy screen indicated?: done 11/06/2017 Last flu, zoster and/or pneumovax: needs PNA/ TDap but declines.  ROS: denies fever, chills, dizziness, LOC, polyuria, polydipsia, unintended weight loss/gain, foot ulcerations, numbness or tingling in extremities or chest pain.   Past Medical History:  Diagnosis Date  . Allergy   . Diabetes mellitus without complication (HCC)   . Hypertension   . Trichimoniasis 01/06/2018   Treated 6/24, POC ____   No past surgical history on file. Social History   Socioeconomic History  . Marital status: Single    Spouse name: Not on file  . Number of children: Not on file  . Years of education: Not on file  . Highest education level: Not on file  Occupational History  . Not on file  Social Needs  . Financial resource strain: Not on file  . Food insecurity:    Worry: Not on file    Inability: Not on file  . Transportation needs:    Medical: Not on file    Non-medical: Not on file  Tobacco Use  . Smoking status: Never Smoker  . Smokeless tobacco: Never Used  Substance and Sexual Activity    . Alcohol use: Yes    Frequency: Never    Comment: social   . Drug use: Never  . Sexual activity: Yes    Birth control/protection: None  Lifestyle  . Physical activity:    Days per week: Not on file    Minutes per session: Not on file  . Stress: Not on file  Relationships  . Social connections:    Talks on phone: Not on file    Gets together: Not on file    Attends religious service: Not on file    Active member of club or organization: Not on file    Attends meetings of clubs or organizations: Not on file    Relationship status: Not on file  . Intimate partner violence:    Fear of current or ex partner: Not on file    Emotionally abused: Not on file    Physically abused: Not on file    Forced sexual activity: Not on file  Other Topics Concern  . Not on file  Social History Narrative  . Not on file   Current Meds  Medication Sig  . atorvastatin (LIPITOR) 20 MG tablet Take 1 tablet (20 mg total) by mouth daily.  . Dulaglutide (TRULICITY) 0.75 MG/0.5ML SOPN Inject 0.75 mg into the skin every 7 (seven) days.  . metFORMIN (GLUCOPHAGE) 1000 MG tablet Take 1 tablet (1,000 mg total)  by mouth 2 (two) times daily with a meal.  . triamterene-hydrochlorothiazide (DYAZIDE) 37.5-25 MG capsule Take 1 each (1 capsule total) by mouth daily.   Family History  Problem Relation Age of Onset  . Diabetes Father   . Pneumonia Paternal Grandfather   . Lung cancer Maternal Grandmother   . Pneumonia Maternal Grandfather    No Known Allergies  ROS: Per HPI  Objective: Office vital signs reviewed. BP 126/86   Pulse (!) 107   Temp (!) 97.2 F (36.2 C) (Oral)   Ht 5\' 3"  (1.6 m)   Wt 195 lb (88.5 kg)   BMI 34.54 kg/m   Physical Examination:  General: Awake, alert, obese, No acute distress HEENT: Normal    Eyes: PERRLA, extraocular movement in tact, sclera white    Throat: moist mucus membranes Cardio: regular rate Pulm:  normal work of breathing on room air GI: Central adiposity  present. Extremities: warm, well perfused, No edema, cyanosis or clubbing; +2 pulses bilaterally MSK: normal gait and normal station   Assessment/ Plan: 37 y.o. female   1. Diabetes mellitus without complication (HCC) A1c has greatly improved since last visit and was 6.8 today.  She is down 13 pounds from previous check.  However, she is having quite a bit of side effects from Trulicity.  These seem to be more than expected.  For this reason, I have instructed her to discontinue Trulicity.  We will start Januvia at therapeutic 100 mg daily in addition to the metformin that she is taking twice daily.  We will plan to recheck A1c in 3 months.  She is to continue monitoring blood sugar daily as directed.  She will schedule diabetic eye exam with Dr. Conley Rolls. - [facility] Hb A1c Waived  2. Hypertension associated with diabetes (HCC) Blood pressure well controlled with current regimen.  No changes made.  3. Hyperlipidemia associated with type 2 diabetes mellitus (HCC) Tolerating Lipitor without difficulty.  4. Morbid obesity (HCC) Down 13 pounds.  Continue diet modification and increasing physical activity.  Discontinue Trulicity as above.   [name], DO [facility] [phone]

## 2018-05-01 ENCOUNTER — Telehealth: Payer: Self-pay | Admitting: Family Medicine

## 2018-05-01 NOTE — Telephone Encounter (Signed)
Please have her come in to be seen to discuss diabetes regimen further.  She is due for recheck.

## 2018-05-01 NOTE — Telephone Encounter (Signed)
Patient aware and appointment given.  

## 2018-05-06 ENCOUNTER — Encounter: Payer: Self-pay | Admitting: *Deleted

## 2018-05-06 ENCOUNTER — Ambulatory Visit (INDEPENDENT_AMBULATORY_CARE_PROVIDER_SITE_OTHER): Payer: Managed Care, Other (non HMO) | Admitting: Family Medicine

## 2018-05-06 ENCOUNTER — Encounter: Payer: Self-pay | Admitting: Family Medicine

## 2018-05-06 VITALS — BP 134/96 | HR 101 | Temp 97.6°F | Ht 63.0 in | Wt 209.0 lb

## 2018-05-06 DIAGNOSIS — E1169 Type 2 diabetes mellitus with other specified complication: Secondary | ICD-10-CM | POA: Diagnosis not present

## 2018-05-06 DIAGNOSIS — I10 Essential (primary) hypertension: Secondary | ICD-10-CM

## 2018-05-06 DIAGNOSIS — E1159 Type 2 diabetes mellitus with other circulatory complications: Secondary | ICD-10-CM

## 2018-05-06 DIAGNOSIS — E119 Type 2 diabetes mellitus without complications: Secondary | ICD-10-CM

## 2018-05-06 DIAGNOSIS — E785 Hyperlipidemia, unspecified: Secondary | ICD-10-CM

## 2018-05-06 LAB — BAYER DCA HB A1C WAIVED: HB A1C (BAYER DCA - WAIVED): 10 % — ABNORMAL HIGH (ref <7.0)

## 2018-05-06 MED ORDER — SITAGLIP PHOS-METFORMIN HCL ER 100-1000 MG PO TB24: 1.0000 | ORAL_TABLET | Freq: Every day | ORAL | 12 refills | Status: DC

## 2018-05-06 NOTE — Patient Instructions (Signed)
Your A1c was 10 today.  I have prescribed the Januvia in COMBO w/ the Metformin.  You can take this medication as a single pill instead of 2 separate pills.  I have also given you a copay sheet, you have to call to activate it and bring it to the pharmacy.  See me in 3 months for recheck.  Get you diabetic eye exam done w/ Dr Conley Rolls.

## 2018-05-06 NOTE — Progress Notes (Signed)
Subjective: CC: F/u DM2 HPI: Melissa Kelley is a 37 y.o. female presenting to clinic today for:  1.  Type 2 Diabetes/ HLD/ HTN:  History: Dx 2014. Sees Dr Conley Rolls at [facility].  No hx retinopathy or DM foot ulcers.   At last visit, she was reporting significant side effects from Trulicity, including nausea, vomiting and abdominal pain. Patient was transitioned from Trulicity 0.75 mg subcu weekly to Januvia 100 mg daily.  She had also noted several hypoglycemic episodes into the 50-60.  Side effects have resulted in her having to miss work several days. She was continued on metformin, Lipitor and Dyazide.  She never started the Januvia because she was unaware that it was available to her at the pharmacy.  She is only been taking the metformin.  BG 200-350.  Last eye exam: Dr Conley Rolls at [facility]. No retinopathy. She has not scheduled this yet. Last foot exam: 11/06/2017 Last A1c: 6.8 on 01/2018 Nephropathy screen indicated?: done 11/06/2017 Last flu, zoster and/or pneumovax: needs PNA/ TDap but declines.  ROS: She reports fatigue despite adequate hours of sleep. She reports polyuria, polydipsia.  No unintended weight loss/gain, foot ulcerations, numbness or tingling in extremities or chest pain.   Past Medical History:  Diagnosis Date  . Allergy   . Diabetes mellitus without complication (HCC)   . Hypertension   . Trichimoniasis 01/06/2018   Treated 6/24, POC ____   No past surgical history on file. Social History   Socioeconomic History  . Marital status: Single    Spouse name: Not on file  . Number of children: Not on file  . Years of education: Not on file  . Highest education level: Not on file  Occupational History  . Not on file  Social Needs  . Financial resource strain: Not on file  . Food insecurity:    Worry: Not on file    Inability: Not on file  . Transportation needs:    Medical: Not on file    Non-medical: Not on file  Tobacco Use  . Smoking status: Never Smoker  . Smokeless  tobacco: Never Used  Substance and Sexual Activity  . Alcohol use: Yes    Frequency: Never    Comment: social   . Drug use: Never  . Sexual activity: Yes    Birth control/protection: None  Lifestyle  . Physical activity:    Days per week: Not on file    Minutes per session: Not on file  . Stress: Not on file  Relationships  . Social connections:    Talks on phone: Not on file    Gets together: Not on file    Attends religious service: Not on file    Active member of club or organization: Not on file    Attends meetings of clubs or organizations: Not on file    Relationship status: Not on file  . Intimate partner violence:    Fear of current or ex partner: Not on file    Emotionally abused: Not on file    Physically abused: Not on file    Forced sexual activity: Not on file  Other Topics Concern  . Not on file  Social History Narrative  . Not on file   No outpatient medications have been marked as taking for the 05/06/18 encounter (Office Visit) with [name], DO.   Family History  Problem Relation Age of Onset  . Diabetes Father   . Pneumonia Paternal Grandfather   . Lung cancer  Maternal Grandmother   . Pneumonia Maternal Grandfather    No Known Allergies  ROS: Per HPI  Objective: Office vital signs reviewed. BP (!) 134/96   Pulse (!) 101   Temp 97.6 F (36.4 C) (Oral)   Ht 5\' 3"  (1.6 m)   Wt 209 lb (94.8 kg)   BMI 37.02 kg/m   Physical Examination:  General: Awake, alert, obese, No acute distress HEENT: Normal    Eyes: extraocular movement in tact, sclera white    Throat: moist mucus membranes Cardio: Regular rate and rhythm.  S1-S2 heard.  No murmurs. Pulm: Clear to auscultation bilaterally.  Normal work of breathing on room air GI: Central adiposity MSK: normal gait and normal station   Assessment/ Plan: 37 y.o. female   1. Diabetes mellitus without complication (HCC) A1c not at goal today.  It was 10, up from 6.8.  I suspect that  high blood sugars are why she is feeling chronically fatigued.  If symptoms are persistent despite adequate blood sugar control, should consider sleep study to look for obesity hypoventilation syndrome. I contacted [facility] to ask about why the Januvia was not filled.  I was told that it was actually received but patient never picked it up.  Her copayment was $ 80.  I gave patient a Januvia coupon today and actually replaced her medication with Janumet and directed her to discontinue metformin since this will be in combination now.  I instructed her to call and activate the coupon and follow-up in 3 months for recheck.  She will continue to monitor blood pressures daily and schedule diabetic eye exam. - [facility] Hb A1c Waived  2. Hypertension associated with diabetes (HCC) Not at goal.  Possibly related to feeling poorly today.  We will recheck this in 3 months.  If needed, we will plan to increase antihypertensive.  3. Hyperlipidemia associated with type 2 diabetes mellitus (HCC) Continue statin.  Meds ordered this encounter  Medications  . SitaGLIPtin-MetFORMIN HCl (JANUMET XR) [phone] MG TB24    Sig: Take 1 tablet by mouth daily.    Dispense:  30 tablet    Refill:  12   Medications Discontinued During This Encounter  Medication Reason  . sitaGLIPtin (JANUVIA) 100 MG tablet Error  . metFORMIN (GLUCOPHAGE) 1000 MG tablet      [name], DO [facility] [phone]

## 2018-05-07 LAB — HM DIABETES EYE EXAM

## 2018-06-06 ENCOUNTER — Ambulatory Visit: Payer: Managed Care, Other (non HMO) | Admitting: Family Medicine

## 2018-06-06 ENCOUNTER — Ambulatory Visit (INDEPENDENT_AMBULATORY_CARE_PROVIDER_SITE_OTHER): Payer: Managed Care, Other (non HMO) | Admitting: Family Medicine

## 2018-06-06 ENCOUNTER — Encounter: Payer: Self-pay | Admitting: Family Medicine

## 2018-06-06 VITALS — BP 122/81 | HR 99 | Temp 97.9°F | Ht 63.0 in | Wt 209.0 lb

## 2018-06-06 DIAGNOSIS — E11628 Type 2 diabetes mellitus with other skin complications: Secondary | ICD-10-CM

## 2018-06-06 DIAGNOSIS — L84 Corns and callosities: Secondary | ICD-10-CM

## 2018-06-06 MED ORDER — DOXYCYCLINE HYCLATE 100 MG PO TABS: 100.0000 mg | ORAL_TABLET | Freq: Two times a day (BID) | ORAL | 0 refills | Status: DC

## 2018-06-06 NOTE — Patient Instructions (Signed)
Corns and Calluses Corns are small areas of thickened skin that occur on the top, sides, or tip of a toe. They contain a cone-shaped core with a point that can press on a nerve below. This causes pain. Calluses are areas of thickened skin that can occur anywhere on the body including hands, fingers, palms, soles of the feet, and heels.Calluses are usually larger than corns. What are the causes? Corns and calluses are caused by rubbing (friction) or pressure, such as from shoes that are too tight or do not fit properly. What increases the risk? Corns are more likely to develop in people who have toe deformities, such as hammer toes. Since calluses can occur with friction to any area of the skin, calluses are more likely to develop in people who:  Work with their hands.  Wear shoes that fit poorly, shoes that are too tight, or shoes that are high-heeled.  Have toes deformities.  What are the signs or symptoms? Symptoms of a corn or callus include:  A hard growth on the skin.  Pain or tenderness under the skin.  Redness and swelling.  Increased discomfort while wearing tight-fitting shoes.  How is this diagnosed? Corns and calluses may be diagnosed with a medical history and physical exam. How is this treated? Corns and calluses may be treated with:  Removing the cause of the friction or pressure. This may include: ? Changing your shoes. ? Wearing shoe inserts (orthotics) or other protective layers in your shoes, such as a corn pad. ? Wearing gloves.  Medicines to help soften skin in the hardened, thickened areas.  Reducing the size of the corn or callus by removing the dead layers of skin.  Antibiotic medicines to treat infection.  Surgery, if a toe deformity is the cause.  Follow these instructions at home:  Take medicines only as directed by your health care provider.  If you were prescribed an antibiotic, finish all of it even if you start to feel better.  Wear  shoes that fit well. Avoid wearing high-heeled shoes and shoes that are too tight or too loose.  Wear any padding, protective layers, gloves, or orthotics as directed by your health care provider.  Soak your hands or feet and then use a file or pumice stone to soften your corn or callus. Do this as directed by your health care provider.  Check your corn or callus every day for signs of infection. Watch for: ? Redness, swelling, or pain. ? Fluid, blood, or pus. Contact a health care provider if:  Your symptoms do not improve with treatment.  You have increased redness, swelling, or pain at the site of your corn or callus.  You have fluid, blood, or pus coming from your corn or callus.  You have new symptoms. This information is not intended to replace advice given to you by your health care provider. Make sure you discuss any questions you have with your health care provider. Document Released: 04/07/2004 Document Revised: 01/20/2016 Document Reviewed: 06/28/2014 Elsevier Interactive Patient Education  2018 Elsevier Inc[employer]  

## 2018-06-06 NOTE — Progress Notes (Signed)
Subjective: CC: Callus PCP: Raliegh IpGottschalk,  M, DO XBJ:[id number]:Melissa Kelley is a 37 y.o. female presenting to clinic today for:  1. Callus Patient reports development of callus on right plantar aspect of the foot.  She notes that she had a palpable knot that turned into the skin drying and sloughing off.  She tried a Ped egg to try and get the skin off but then discontinued use after she found out diabetic should not use these.  She does report some pain with certain footwear.  She has been keeping her feet protected.  She notes improvement in sugars since starting the Januvia.  She reports increased energy as well with control of blood sugars.  No fevers, purulence, erythema.   ROS: Per HPI  No Known Allergies Past Medical History:  Diagnosis Date  . Allergy   . Diabetes mellitus without complication (HCC)   . Hypertension   . Trichimoniasis 01/06/2018   Treated 6/24, POC ____    Current Outpatient Medications:  .  atorvastatin (LIPITOR) 20 MG tablet, Take 1 tablet (20 mg total) by mouth daily., Disp: 30 tablet, Rfl: 12 .  SitaGLIPtin-MetFORMIN HCl (JANUMET XR) [phone] MG TB24, Take 1 tablet by mouth daily., Disp: 30 tablet, Rfl: 12 .  triamterene-hydrochlorothiazide (DYAZIDE) 37.5-25 MG capsule, Take 1 each (1 capsule total) by mouth daily., Disp: 30 capsule, Rfl: 12 Social History   Socioeconomic History  . Marital status: Single    Spouse name: Not on file  . Number of children: Not on file  . Years of education: Not on file  . Highest education level: Not on file  Occupational History  . Not on file  Social Needs  . Financial resource strain: Not on file  . Food insecurity:    Worry: Not on file    Inability: Not on file  . Transportation needs:    Medical: Not on file    Non-medical: Not on file  Tobacco Use  . Smoking status: Never Smoker  . Smokeless tobacco: Never Used  Substance and Sexual Activity  . Alcohol use: Yes    Frequency: Never    Comment: social   .  Drug use: Never  . Sexual activity: Yes    Birth control/protection: None  Lifestyle  . Physical activity:    Days per week: Not on file    Minutes per session: Not on file  . Stress: Not on file  Relationships  . Social connections:    Talks on phone: Not on file    Gets together: Not on file    Attends religious service: Not on file    Active member of club or organization: Not on file    Attends meetings of clubs or organizations: Not on file    Relationship status: Not on file  . Intimate partner violence:    Fear of current or ex partner: Not on file    Emotionally abused: Not on file    Physically abused: Not on file    Forced sexual activity: Not on file  Other Topics Concern  . Not on file  Social History Narrative  . Not on file   Family History  Problem Relation Age of Onset  . Diabetes Father   . Pneumonia Paternal Grandfather   . Lung cancer Maternal Grandmother   . Pneumonia Maternal Grandfather     Objective: Office vital signs reviewed. BP 122/81   Pulse 99   Temp 97.9 F (36.6 C) (Oral)   Ht 5\' 3"  (  1.6 m)   Wt 209 lb (94.8 kg)   LMP 05/14/2018   BMI 37.02 kg/m   Physical Examination:  General: Awake, alert, well nourished, well appearing. No acute distress Extremities: warm, well perfused, No edema, cyanosis or clubbing; +2 pulses bilaterally  Right foot; half dollar sized area of sloughing skin.  There is no associated erythema, exudate or bleeding.  No increased warmth.  There is a palpable callus that is roughly pea-sized at the apex of the second metatarsal.  Assessment/ Plan: 37 y.o. female   1. Type 2 diabetes mellitus with pressure callus (HCC) Skin is currently sloughing off.  I do question if this started out as a blister.  She has a palpable callus at the apex of the second metatarsal.  No evidence of secondary infection but given history of diabetes and upcoming holiday I have given her written prescription for doxycycline to use twice  daily should she develop any worrisome symptoms or signs of infection.  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  She will follow-up PRN.  We discussed that if she does need to use the medication, I would like her to contact me as this would be an indication to obtain diabetic shoes.   No orders of the defined types were placed in this encounter.  Meds ordered this encounter  Medications  . doxycycline (VIBRA-TABS) 100 MG tablet    Sig: Take 1 tablet (100 mg total) by mouth 2 (two) times daily.    Dispense:  20 tablet    Refill:  0      Hulen Skains, DO [facility] [phone]

## 2018-08-06 ENCOUNTER — Ambulatory Visit: Payer: Managed Care, Other (non HMO) | Admitting: Family Medicine

## 2018-09-09 ENCOUNTER — Ambulatory Visit (INDEPENDENT_AMBULATORY_CARE_PROVIDER_SITE_OTHER): Payer: Managed Care, Other (non HMO) | Admitting: Family Medicine

## 2018-09-09 ENCOUNTER — Encounter: Payer: Self-pay | Admitting: Family Medicine

## 2018-09-09 VITALS — BP 148/88 | HR 102 | Temp 97.2°F | Ht 63.0 in | Wt 211.0 lb

## 2018-09-09 DIAGNOSIS — S46811A Strain of other muscles, fascia and tendons at shoulder and upper arm level, right arm, initial encounter: Secondary | ICD-10-CM | POA: Diagnosis not present

## 2018-09-09 MED ORDER — CYCLOBENZAPRINE HCL 5 MG PO TABS: 5.0000 mg | ORAL_TABLET | Freq: Three times a day (TID) | ORAL | 0 refills | Status: AC | PRN

## 2018-09-09 MED ORDER — NAPROXEN 500 MG PO TABS: 500.0000 mg | ORAL_TABLET | Freq: Two times a day (BID) | ORAL | 0 refills | Status: AC

## 2018-09-09 NOTE — Progress Notes (Signed)
Subjective:  Patient ID: Melissa Kelley, female    DOB: 09/10/1980, 38 y.o.   MRN: [id number]  Chief Complaint:  Pain radiating from back of head into right shoulder and arm (symptoms began a week ago)   HPI: Melissa Fetchikkiya Victoria is a 38 y.o. female presenting on 09/09/2018 for Pain radiating from back of head into right shoulder and arm (symptoms began a week ago)  Pt presents today with complaints of right neck and right shoulder pain. Pt states this started last Saturday and is not getting better. States the pain is stiff and shooting at times. States 7/10 at worst. States she has tried lidocaine patches and aleve with some relief of symptoms. The pain is worse with lifting the arm and turing head to the left. Pt denies loss of function, weakness, injury, or numbness. States it is difficult to put her bra on due to the stiffness.   Relevant past medical, surgical, family, and social history reviewed and updated as indicated.  Allergies and medications reviewed and updated.   Past Medical History:  Diagnosis Date  . Allergy   . Diabetes mellitus without complication (HCC)   . Hypertension   . Trichimoniasis 01/06/2018   Treated 6/24, POC ____    History reviewed. No pertinent surgical history.  Social History   Socioeconomic History  . Marital status: Single    Spouse name: Not on file  . Number of children: Not on file  . Years of education: Not on file  . Highest education level: Not on file  Occupational History  . Not on file  Social Needs  . Financial resource strain: Not on file  . Food insecurity:    Worry: Not on file    Inability: Not on file  . Transportation needs:    Medical: Not on file    Non-medical: Not on file  Tobacco Use  . Smoking status: Never Smoker  . Smokeless tobacco: Never Used  Substance and Sexual Activity  . Alcohol use: Yes    Frequency: Never    Comment: social   . Drug use: Never  . Sexual activity: Yes    Birth control/protection: None    Lifestyle  . Physical activity:    Days per week: Not on file    Minutes per session: Not on file  . Stress: Not on file  Relationships  . Social connections:    Talks on phone: Not on file    Gets together: Not on file    Attends religious service: Not on file    Active member of club or organization: Not on file    Attends meetings of clubs or organizations: Not on file    Relationship status: Not on file  . Intimate partner violence:    Fear of current or ex partner: Not on file    Emotionally abused: Not on file    Physically abused: Not on file    Forced sexual activity: Not on file  Other Topics Concern  . Not on file  Social History Narrative  . Not on file    Outpatient Encounter Medications as of 09/09/2018  Medication Sig  . atorvastatin (LIPITOR) 20 MG tablet Take 1 tablet (20 mg total) by mouth daily.  Marland Kitchen. doxycycline (VIBRA-TABS) 100 MG tablet Take 1 tablet (100 mg total) by mouth 2 (two) times daily.  . SitaGLIPtin-MetFORMIN HCl (JANUMET XR) [phone] MG TB24 Take 1 tablet by mouth daily.  Marland Kitchen. triamterene-hydrochlorothiazide (DYAZIDE) 37.5-25 MG capsule Take 1  each (1 capsule total) by mouth daily.  . cyclobenzaprine (FLEXERIL) 5 MG tablet Take 1 tablet (5 mg total) by mouth 3 (three) times daily as needed for up to 14 days for muscle spasms.  . naproxen (NAPROSYN) 500 MG tablet Take 1 tablet (500 mg total) by mouth 2 (two) times daily with a meal for 14 days.   No facility-administered encounter medications on file as of 09/09/2018.     No Known Allergies  Review of Systems  Constitutional: Positive for activity change. Negative for appetite change, chills, fatigue and fever.  Respiratory: Negative for cough and shortness of breath.   Cardiovascular: Negative for chest pain, palpitations and leg swelling.  Musculoskeletal: Positive for myalgias and neck stiffness. Negative for arthralgias, back pain and joint swelling.  Neurological: Negative for dizziness,  weakness, numbness and headaches.  Psychiatric/Behavioral: Negative for confusion.  All other systems reviewed and are negative.       Objective:  BP (!) 148/88   Pulse (!) 102   Temp (!) 97.2 F (36.2 C) (Oral)   Ht 5\' 3"  (1.6 m)   Wt 211 lb (95.7 kg)   BMI 37.38 kg/m    Wt Readings from Last 3 Encounters:  09/09/18 211 lb (95.7 kg)  06/06/18 209 lb (94.8 kg)  05/06/18 209 lb (94.8 kg)    Physical Exam Vitals signs and nursing note reviewed.  Constitutional:      General: She is not in acute distress.    Appearance: Normal appearance. She is well-developed and well-groomed. She is not ill-appearing or toxic-appearing.  HENT:     Head: Normocephalic and atraumatic.  Eyes:     Conjunctiva/sclera: Conjunctivae normal.     Pupils: Pupils are equal, round, and reactive to light.  Cardiovascular:     Rate and Rhythm: Normal rate and regular rhythm.     Pulses:          Radial pulses are 2+ on the right side and 2+ on the left side.     Heart sounds: Normal heart sounds. No murmur. No friction rub. No gallop.   Pulmonary:     Effort: Pulmonary effort is normal. No respiratory distress.     Breath sounds: Normal breath sounds.  Musculoskeletal:     Right shoulder: She exhibits decreased range of motion (pain with abduction), tenderness (posterior tenderness), pain and spasm. She exhibits no bony tenderness, no swelling, no effusion, no crepitus, no deformity, no laceration, normal pulse and normal strength.     Left shoulder: Normal.     Cervical back: She exhibits decreased range of motion (pain wtih lateral rotation), tenderness, pain and spasm. She exhibits no bony tenderness, no swelling, no edema, no deformity, no laceration and normal pulse.       Back:       Arms:  Lymphadenopathy:     Cervical: No cervical adenopathy.  Skin:    General: Skin is warm and dry.     Capillary Refill: Capillary refill takes less than 2 seconds.  Neurological:     General: No focal  deficit present.     Mental Status: She is alert and oriented to person, place, and time.     Cranial Nerves: No cranial nerve deficit.     Motor: No weakness.     Coordination: Coordination normal.     Gait: Gait normal.     Deep Tendon Reflexes: Reflexes normal.  Psychiatric:        Mood and Affect: Mood normal.  Behavior: Behavior normal. Behavior is cooperative.        Thought Content: Thought content normal.        Judgment: Judgment normal.     Results for orders placed or performed in visit on 05/14/18  HM DIABETES EYE EXAM  Result Value Ref Range   HM Diabetic Eye Exam No Retinopathy No Retinopathy       Pertinent labs & imaging results that were available during my care of the patient were reviewed by me and considered in my medical decision making.  Assessment & Plan:  Latacia was seen today for pain radiating from back of head into right shoulder and arm.  Diagnoses and all orders for this visit:  Trapezius muscle strain, right, initial encounter Symptomatic care discussed. Moist heat. BioFreeze. Medications as prescribed. Report any new or worsening symptoms.  -     naproxen (NAPROSYN) 500 MG tablet; Take 1 tablet (500 mg total) by mouth 2 (two) times daily with a meal for 14 days. -     cyclobenzaprine (FLEXERIL) 5 MG tablet; Take 1 tablet (5 mg total) by mouth 3 (three) times daily as needed for up to 14 days for muscle spasms.     Continue all other maintenance medications.  Follow up plan: Return if symptoms worsen or fail to improve.  Educational handout given for muscle strain  The above assessment and management plan was discussed with the patient. The patient verbalized understanding of and has agreed to the management plan. Patient is aware to call the clinic if symptoms persist or worsen. Patient is aware when to return to the clinic for a follow-up visit. Patient educated on when it is appropriate to go to the emergency department.   Kari Baars, FNP-C [facility] [phone]

## 2018-09-09 NOTE — Patient Instructions (Signed)
Muscle Strain A muscle strain is an injury that happens when a muscle is stretched longer than normal. This can happen during a fall, sports, or lifting. This can tear some muscle fibers. Usually, recovery from muscle strain takes 1-2 weeks. Complete healing normally takes 5-6 weeks. This condition is first treated with PRICE therapy. This involves:  Protecting your muscle from being injured again.  Resting your injured muscle.  Icing your injured muscle.  Applying pressure (compression) to your injured muscle. This may be done with a splint or elastic bandage.  Raising (elevating) your injured muscle. Your doctor may also recommend medicine for pain. Follow these instructions at home: If you have a splint:  Wear the splint as told by your doctor. Take it off only as told by your doctor.  Loosen the splint if your fingers or toes tingle, get numb, or turn cold and blue.  Keep the splint clean.  If the splint is not waterproof: ? Do not let it get wet. ? Cover it with a watertight covering when you take a bath or a shower. Managing pain, stiffness, and swelling   If directed, put ice on your injured area. ? If you have a removable splint, take it off as told by your doctor. ? Put ice in a plastic bag. ? Place a towel between your skin and the bag. ? Leave the ice on for 20 minutes, 2-3 times a day.  Move your fingers or toes often. This helps to avoid stiffness and lessen swelling.  Raise your injured area above the level of your heart while you are sitting or lying down.  Wear an elastic bandage as told by your doctor. Make sure it is not too tight. General instructions  Take over-the-counter and prescription medicines only as told by your doctor.  Limit your activity. Rest your injured muscle as told by your doctor. Your doctor may say that gentle movements are okay.  If physical therapy was prescribed, do exercises as told by your doctor.  Do not put pressure on any  part of the splint until it is fully hardened. This may take many hours.  Do not use any products that contain nicotine or tobacco, such as cigarettes and e-cigarettes. These can delay bone healing. If you need help quitting, ask your doctor.  Warm up before you exercise. This helps to prevent more muscle strains.  Ask your doctor when it is safe to drive if you have a splint.  Keep all follow-up visits as told by your doctor. This is important. Contact a doctor if:  You have more pain or swelling in your injured area. Get help right away if:  You have any of these problems in your injured area: ? You have numbness. ? You have tingling. ? You lose a lot of strength. Summary  A muscle strain is an injury that happens when a muscle is stretched longer than normal.  This condition is first treated with PRICE therapy. This includes protecting, resting, icing, adding pressure, and raising your injury.  Limit your activity. Rest your injured muscle as told by your doctor. Your doctor may say that gentle movements are okay.  Warm up before you exercise. This helps to prevent more muscle strains. This information is not intended to replace advice given to you by your health care provider. Make sure you discuss any questions you have with your health care provider. Document Released: 04/10/2008 Document Revised: 08/08/2016 Document Reviewed: 08/08/2016 Elsevier Interactive Patient Education  2019 Elsevier   Inc.  

## 2018-11-03 ENCOUNTER — Ambulatory Visit (INDEPENDENT_AMBULATORY_CARE_PROVIDER_SITE_OTHER): Payer: Managed Care, Other (non HMO) | Admitting: Family Medicine

## 2018-11-03 ENCOUNTER — Other Ambulatory Visit: Payer: Self-pay

## 2018-11-03 DIAGNOSIS — M542 Cervicalgia: Secondary | ICD-10-CM

## 2018-11-03 DIAGNOSIS — E119 Type 2 diabetes mellitus without complications: Secondary | ICD-10-CM

## 2018-11-03 DIAGNOSIS — E785 Hyperlipidemia, unspecified: Secondary | ICD-10-CM | POA: Diagnosis not present

## 2018-11-03 DIAGNOSIS — M5412 Radiculopathy, cervical region: Secondary | ICD-10-CM | POA: Diagnosis not present

## 2018-11-03 DIAGNOSIS — E1169 Type 2 diabetes mellitus with other specified complication: Secondary | ICD-10-CM

## 2018-11-03 MED ORDER — NAPROXEN 500 MG PO TABS: 500.0000 mg | ORAL_TABLET | Freq: Two times a day (BID) | ORAL | 0 refills | Status: DC

## 2018-11-03 MED ORDER — CYCLOBENZAPRINE HCL 5 MG PO TABS: 5.0000 mg | ORAL_TABLET | Freq: Three times a day (TID) | ORAL | 0 refills | Status: DC | PRN

## 2018-11-03 NOTE — Progress Notes (Signed)
Telephone visit  Subjective: CC: Neck pain PCP: Janora Norlander, DO ONG:EXBMWUX Melissa Kelley is a 38 y.o. female calls for telephone consult today. Patient provides verbal consent for consult held via phone.  Location of patient: work Location of provider: [facility] Others present for call: none  1. Neck pain Patient reports onset of neck pain a couple of months ago.  She was treated with Naprosyn and a muscle relaxer and it did resolve.  However, it started again over the last week or so and is radiating to her shoulder and upper back.  She has been using over-the-counter lidocaine patches from [facility] which does seem to help a little but is not fully resolved.  Pain seems worse with reaching behind her/extending the right upper extremity.  Pain radiates from the neck down into the tips of her fingers and is sharp and electric-like.  She reports associated numbness and tingling but no overt weakness.  Pain often will occur if she turns her head quickly.  2.  Type 2 diabetes w/ HLD and HTN Patient reports compliance with Janumet X are [phone] mg every morning and 1000 mg every afternoon.  Her weight and blood sugar are not well controlled as they were with the Trulicity but she is able to keep food down.  No chest pain, shortness of breath.  She has numbness and tingling of the right upper extremity as above.  She is compliant with Lipitor and Dyazide.   ROS: Per HPI  No Known Allergies Past Medical History:  Diagnosis Date  . Allergy   . Diabetes mellitus without complication ([place])   . Hypertension   . Trichimoniasis 01/06/2018   Treated 6/24, POC ____    Current Outpatient Medications:  .  atorvastatin (LIPITOR) 20 MG tablet, Take 1 tablet (20 mg total) by mouth daily., Disp: 30 tablet, Rfl: 12 .  SitaGLIPtin-MetFORMIN HCl (JANUMET XR) [phone] MG TB24, Take 1 tablet by mouth daily., Disp: 30 tablet, Rfl: 12 .  triamterene-hydrochlorothiazide (DYAZIDE) 37.5-25 MG capsule, Take 1 each (1  capsule total) by mouth daily., Disp: 30 capsule, Rfl: 12  Assessment/ Plan: 38 y.o. female   1. Neck pain on right side I question nerve involvement given radiation down the right upper extremity and associated numbness and tingling.  Because she was responsive to Naprosyn and Flexeril, we will repeat this.  I have provided her a work excuse through the remainder of the week.  Home physical therapy exercises provided as well.  We discussed that she may need to consider being evaluated by an orthopedist at some time, particularly if symptoms do not resolved or are recurrent.  She voiced good understanding. - naproxen (NAPROSYN) 500 MG tablet; Take 1 tablet (500 mg total) by mouth 2 (two) times daily with a meal. (if needed for pain)  Dispense: 30 tablet; Refill: 0 - cyclobenzaprine (FLEXERIL) 5 MG tablet; Take 1 tablet (5 mg total) by mouth 3 (three) times daily as needed for muscle spasms.  Dispense: 30 tablet; Refill: 0  2. Cervical radiculopathy As above - naproxen (NAPROSYN) 500 MG tablet; Take 1 tablet (500 mg total) by mouth 2 (two) times daily with a meal. (if needed for pain)  Dispense: 30 tablet; Refill: 0 - cyclobenzaprine (FLEXERIL) 5 MG tablet; Take 1 tablet (5 mg total) by mouth 3 (three) times daily as needed for muscle spasms.  Dispense: 30 tablet; Refill: 0  3. Diabetes mellitus without complication (HCC) Check nonfasting lipid panel, metabolic panel and L2G. - Lipid Panel -  CMP14+EGFR - Bayer DCA Hb A1c Waived  4. Hyperlipidemia associated with type 2 diabetes mellitus ([place]) As above - Lipid Panel - CMP14+EGFR   Start time: 11:38a End time: 11:47am  Total time spent on patient care (including telephone call/ virtual visit): 17 minutes  [place], [place] [phone]

## 2018-11-03 NOTE — Patient Instructions (Signed)
You had labs performed today.  You will be contacted with the results of the labs once they are available, usually in the next 3 business days for routine lab work.   Radicular Pain Radicular pain is a type of pain that spreads from your back or neck along a spinal nerve. Spinal nerves are nerves that leave the spinal cord and go to the muscles. Radicular pain is sometimes called radiculopathy, radiculitis, or a pinched nerve. When you have this type of pain, you may also have weakness, numbness, or tingling in the area of your body that is supplied by the nerve. The pain may feel sharp and burning. Depending on which spinal nerve is affected, the pain may occur in the:  Neck area (cervical radicular pain). You may also feel pain, numbness, weakness, or tingling in the arms.  Mid-spine area (thoracic radicular pain). You would feel this pain in the back and chest. This type is rare.  Lower back area (lumbar radicular pain). You would feel this pain as low back pain. You may feel pain, numbness, weakness, or tingling in the buttocks or legs. Sciatica is a type of lumbar radicular pain that shoots down the back of the leg. Radicular pain occurs when one of the spinal nerves becomes irritated or squeezed (compressed). It is often caused by something pushing on a spinal nerve, such as one of the bones of the spine (vertebrae) or one of the round cushions between vertebrae (intervertebral disks). This can result from:  An injury.  Wear and tear or aging of a disk.  The growth of a bone spur that pushes on the nerve. Radicular pain often goes away when you follow instructions from your health care provider for relieving pain at home. Follow these instructions at home: Managing pain      If directed, put ice on the affected area: ? Put ice in a plastic bag. ? Place a towel between your skin and the bag. ? Leave the ice on for 20 minutes, 2-3 times a day.  If directed, apply heat to the affected  area as often as told by your health care provider. Use the heat source that your health care provider recommends, such as a moist heat pack or a heating pad. ? Place a towel between your skin and the heat source. ? Leave the heat on for 20-30 minutes. ? Remove the heat if your skin turns bright red. This is especially important if you are unable to feel pain, heat, or cold. You may have a greater risk of getting burned. Activity   Do not sit or rest in bed for long periods of time.  Try to stay as active as possible. Ask your health care provider what type of exercise or activity is best for you.  Avoid activities that make your pain worse, such as bending and lifting.  Do not lift anything that is heavier than 10 lb (4.5 kg), or the limit that you are told, until your health care provider says that it is safe.  Practice using proper technique when lifting items. Proper lifting technique involves bending your knees and rising up.  Do strength and range-of-motion exercises only as told by your health care provider or physical therapist. General instructions  Take over-the-counter and prescription medicines only as told by your health care provider.  Pay attention to any changes in your symptoms.  Keep all follow-up visits as told by your health care provider. This is important. ? Your health care  provider may send you to a physical therapist to help with this pain. Contact a health care provider if:  Your pain and other symptoms get worse.  Your pain medicine is not helping.  Your pain has not improved after a few weeks of home care.  You have a fever. Get help right away if:  You have severe pain, weakness, or numbness.  You have difficulty with bladder or bowel control. Summary  Radicular pain is a type of pain that spreads from your back or neck along a spinal nerve.  When you have radicular pain, you may also have weakness, numbness, or tingling in the area of your  body that is supplied by the nerve.  The pain may feel sharp or burning.  Radicular pain may be treated with ice, heat, medicines, or physical therapy. This information is not intended to replace advice given to you by your health care provider. Make sure you discuss any questions you have with your health care provider. Document Released: 08/09/2004 Document Revised: 01/14/2018 Document Reviewed: 01/14/2018 Elsevier Interactive Patient Education  2019 [employer]

## 2018-11-17 ENCOUNTER — Other Ambulatory Visit: Payer: Self-pay | Admitting: Family Medicine

## 2018-12-06 ENCOUNTER — Other Ambulatory Visit: Payer: Self-pay | Admitting: Family Medicine

## 2018-12-09 ENCOUNTER — Telehealth: Payer: Self-pay | Admitting: Family Medicine

## 2018-12-09 ENCOUNTER — Other Ambulatory Visit: Payer: Self-pay | Admitting: Family Medicine

## 2018-12-09 MED ORDER — METFORMIN HCL 1000 MG PO TABS: 1000.0000 mg | ORAL_TABLET | Freq: Two times a day (BID) | ORAL | 0 refills | Status: DC

## 2018-12-09 MED ORDER — TRIAMTERENE-HCTZ 37.5-25 MG PO CAPS: 1.0000 | ORAL_CAPSULE | Freq: Every day | ORAL | 0 refills | Status: DC

## 2018-12-09 MED ORDER — GLIPIZIDE ER 5 MG PO TB24: 5.0000 mg | ORAL_TABLET | Freq: Every day | ORAL | 0 refills | Status: DC

## 2018-12-09 NOTE — Telephone Encounter (Signed)
Ok to discontinue the Janumet.  I want her to continue Metformin 1000mg  BID.  I will add Glipizide to take once daily.  Please make sure that she has follow up in July for DM.

## 2018-12-09 NOTE — Telephone Encounter (Signed)
Pt aware.

## 2019-02-27 ENCOUNTER — Other Ambulatory Visit: Payer: Self-pay | Admitting: Family Medicine

## 2019-02-27 ENCOUNTER — Telehealth: Payer: Self-pay | Admitting: Family Medicine

## 2019-02-27 DIAGNOSIS — U071 COVID-19: Secondary | ICD-10-CM

## 2019-02-27 MED ORDER — XOFLUZA (80 MG DOSE) 2 X 40 MG PO TBPK: 80.0000 mg | ORAL_TABLET | Freq: Once | ORAL | 0 refills | Status: AC

## 2019-02-27 NOTE — Telephone Encounter (Signed)
Patient aware.

## 2019-02-27 NOTE — Telephone Encounter (Signed)
I've read an article that Marlis Edelson has been effective in about 10% of COVID patients if administered in the EARLY stages.  I'd be glad to rx the med.  Unsure if insurance will cover though, given lack of major data and the fact that it's indication at this time is for flu.  Med has been sent.  If symptoms worsen, she is to seek immediate medical attention in the ED>

## 2019-02-27 NOTE — Telephone Encounter (Signed)
Patient states that she tested positive COVID. Patient states that she is having dyspnea and fatigue no fevers.  Patient states that Aunt who is a physician recommended Xofluza.

## 2019-04-01 ENCOUNTER — Other Ambulatory Visit: Payer: Self-pay | Admitting: Family Medicine

## 2019-04-10 ENCOUNTER — Other Ambulatory Visit: Payer: Self-pay | Admitting: Family Medicine

## 2019-04-10 ENCOUNTER — Other Ambulatory Visit: Payer: Self-pay | Admitting: *Deleted

## 2019-04-10 MED ORDER — GLIPIZIDE ER 5 MG PO TB24: 5.0000 mg | ORAL_TABLET | Freq: Every day | ORAL | 0 refills | Status: DC

## 2019-04-10 MED ORDER — METFORMIN HCL 1000 MG PO TABS: 1000.0000 mg | ORAL_TABLET | Freq: Two times a day (BID) | ORAL | 0 refills | Status: DC

## 2019-04-10 MED ORDER — TRIAMTERENE-HCTZ 37.5-25 MG PO CAPS: 1.0000 | ORAL_CAPSULE | Freq: Every day | ORAL | 0 refills | Status: DC

## 2019-04-10 MED ORDER — ATORVASTATIN CALCIUM 20 MG PO TABS: 20.0000 mg | ORAL_TABLET | Freq: Every day | ORAL | 12 refills | Status: DC

## 2019-04-10 NOTE — Telephone Encounter (Signed)
Refills sent to pharmacy..  Will need to be seen for any further refills.

## 2019-04-22 ENCOUNTER — Emergency Department (HOSPITAL_COMMUNITY): Payer: Managed Care, Other (non HMO)

## 2019-04-22 ENCOUNTER — Emergency Department (HOSPITAL_COMMUNITY)
Admission: EM | Admit: 2019-04-22 | Discharge: 2019-04-22 | Disposition: A | Discharge status: Home / Self Care | Payer: Managed Care, Other (non HMO) | Attending: Emergency Medicine | Admitting: Emergency Medicine

## 2019-04-22 ENCOUNTER — Other Ambulatory Visit: Payer: Self-pay

## 2019-04-22 DIAGNOSIS — Z7984 Long term (current) use of oral hypoglycemic drugs: Secondary | ICD-10-CM | POA: Insufficient documentation

## 2019-04-22 DIAGNOSIS — Z79899 Other long term (current) drug therapy: Secondary | ICD-10-CM | POA: Diagnosis not present

## 2019-04-22 DIAGNOSIS — R0600 Dyspnea, unspecified: Secondary | ICD-10-CM | POA: Diagnosis not present

## 2019-04-22 DIAGNOSIS — E119 Type 2 diabetes mellitus without complications: Secondary | ICD-10-CM | POA: Insufficient documentation

## 2019-04-22 DIAGNOSIS — I10 Essential (primary) hypertension: Secondary | ICD-10-CM | POA: Diagnosis not present

## 2019-04-22 DIAGNOSIS — R0602 Shortness of breath: Secondary | ICD-10-CM | POA: Diagnosis present

## 2019-04-22 LAB — URINALYSIS, ROUTINE W REFLEX MICROSCOPIC
Bacteria, UA: NONE SEEN
Bilirubin Urine: NEGATIVE
Glucose, UA: >=500 mg/dL — AB
Ketones, ur: NEGATIVE mg/dL
Leukocytes,Ua: NEGATIVE
Nitrite: NEGATIVE
Protein, ur: NEGATIVE mg/dL
Specific Gravity, Urine: 1.018 (ref 1.005–1.030)
pH: 6 (ref 5.0–8.0)

## 2019-04-22 LAB — CBC WITH DIFFERENTIAL/PLATELET
Abs Immature Granulocytes: 0.01 10*3/uL (ref 0.00–0.07)
Basophils Absolute: 0 10*3/uL (ref 0.0–0.1)
Basophils Relative: 0 %
Eosinophils Absolute: 0.1 10*3/uL (ref 0.0–0.5)
Eosinophils Relative: 1 %
HCT: 40 % (ref 36.0–46.0)
Hemoglobin: 12.9 g/dL (ref 12.0–15.0)
Immature Granulocytes: 0 %
Lymphocytes Relative: 46 %
Lymphs Abs: 2.8 10*3/uL (ref 0.7–4.0)
MCH: 30.6 pg (ref 26.0–34.0)
MCHC: 32.3 g/dL (ref 30.0–36.0)
MCV: 95 fL (ref 80.0–100.0)
Monocytes Absolute: 0.3 10*3/uL (ref 0.1–1.0)
Monocytes Relative: 5 %
Neutro Abs: 2.9 10*3/uL (ref 1.7–7.7)
Neutrophils Relative %: 48 %
Platelets: 331 10*3/uL (ref 150–400)
RBC: 4.21 MIL/uL (ref 3.87–5.11)
RDW: 13.7 % (ref 11.5–15.5)
WBC: 6.1 10*3/uL (ref 4.0–10.5)
nRBC: 0 % (ref 0.0–0.2)

## 2019-04-22 LAB — COMPREHENSIVE METABOLIC PANEL
ALT: 31 U/L (ref 0–44)
AST: 27 U/L (ref 15–41)
Albumin: 3.9 g/dL (ref 3.5–5.0)
Alkaline Phosphatase: 70 U/L (ref 38–126)
Anion gap: 12 (ref 5–15)
BUN: 9 mg/dL (ref 6–20)
CO2: 26 mmol/L (ref 22–32)
Calcium: 9.1 mg/dL (ref 8.9–10.3)
Chloride: 97 mmol/L — ABNORMAL LOW (ref 98–111)
Creatinine, Ser: 0.52 mg/dL (ref 0.44–1.00)
GFR calc Af Amer: >60 mL/min (ref >60)
GFR calc non Af Amer: >60 mL/min (ref >60)
Glucose, Bld: 273 mg/dL — ABNORMAL HIGH (ref 70–99)
Potassium: 3.6 mmol/L (ref 3.5–5.1)
Sodium: 135 mmol/L (ref 135–145)
Total Bilirubin: 0.4 mg/dL (ref 0.3–1.2)
Total Protein: 7.5 g/dL (ref 6.5–8.1)

## 2019-04-22 LAB — TROPONIN I (HIGH SENSITIVITY)
Troponin I (High Sensitivity): <2 ng/L (ref <18)
Troponin I (High Sensitivity): 2 ng/L (ref <18)

## 2019-04-22 LAB — PREGNANCY, URINE: Preg Test, Ur: NEGATIVE

## 2019-04-22 MED ORDER — ALBUTEROL SULFATE HFA 108 (90 BASE) MCG/ACT IN AERS
2.0000 | INHALATION_SPRAY | Freq: Once | RESPIRATORY_TRACT | Status: AC
  Administered 2019-04-22: 15:00:00 2 via RESPIRATORY_TRACT
  Filled 2019-04-22: qty 6.7

## 2019-04-22 NOTE — ED Provider Notes (Signed)
[facility]Y [facility]PARTMENT Provider Note   CSN: [id number] Arrival date & time: 04/22/19  0941     History   Chief Complaint Chief Complaint  Patient presents with  . Shortness of Breath    HPI Melissa Kelley is a 38 y.o. female.     Patient is a 38 year old female who presents to the emergency department with a complaint of shortness of breath and fatigue.  The patient states that she tested positive for COVID in August 2020.  She says that she quarantined herself and has now had 2- COVID tests in order for her to be able to return to her work duty.  The second negative test was on September 16.  Most recently the patient states she has been having problems with shortness of breath when she does certain activities accompanied by chest tightness and discomfort.  This actually started about 2 weeks ago.  The patient also complains of mild to moderate headache.  She says she has easy fatigue with activities that she usually does without any fatigue at all.  She denies cough, fever, vomiting, or diarrhea.  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  It is also of note that she is diabetic.  The history is provided by the patient.  Shortness of Breath Associated symptoms: no abdominal pain, no chest pain, no cough, no ear pain, no neck pain, no rash, no vomiting and no wheezing     Past Medical History:  Diagnosis Date  . Allergy   . Diabetes mellitus without complication (HCC)   . Hypertension   . Trichimoniasis 01/06/2018   Treated 6/24, POC ____    Patient Active Problem List   Diagnosis Date Noted  . Hyperlipidemia associated with type 2 diabetes mellitus (HCC) 02/10/2018  . Trichimoniasis 01/06/2018  . Menorrhagia with irregular cycle 01/01/2018  . Encounter for gynecological examination with Papanicolaou smear of cervix 01/01/2018  . Hypertension associated with diabetes (HCC)  11/06/2017  . Diabetes mellitus without complication (HCC) 11/06/2017  . Morbid obesity (HCC) 11/06/2017    No past surgical history on file.   OB History    Gravida  2   Para  2   Term  2   Preterm      AB      Living  2     SAB      TAB      Ectopic      Multiple      Live Births  2            Home Medications    Prior to Admission medications   Medication Sig Start Date End Date Taking? Authorizing Provider  cyclobenzaprine (FLEXERIL) 5 MG tablet Take 1 tablet (5 mg total) by mouth 3 (three) times daily as needed for muscle spasms. 11/03/18  Yes Delynn FlavinGottschalk, Ashly M, DO  glipiZIDE (GLIPIZIDE XL) 5 MG 24 hr tablet Take 1 tablet (5 mg total) by mouth daily with breakfast. 04/10/19  Yes Nadine CountsGottschalk, Ashly M, DO  metFORMIN (GLUCOPHAGE) 1000 MG tablet Take 1 tablet (1,000 mg total) by mouth 2 (two) times daily with a meal. 04/10/19  Yes Gottschalk, Ashly M, DO  naproxen (NAPROSYN) 500 MG tablet Take 1 tablet (500 mg total) by mouth 2 (two) times daily with a meal. (if needed for pain) 11/03/18  Yes Gottschalk, Ashly M, DO  triamterene-hydrochlorothiazide (DYAZIDE) 37.5-25 MG capsule Take  1 each (1 capsule total) by mouth daily. 04/10/19  Yes Gottschalk, Kathie Rhodes M, DO  atorvastatin (LIPITOR) 20 MG tablet Take 1 tablet (20 mg total) by mouth daily. 04/10/19   [name], DO    Family History Family History  Problem Relation Age of Onset  . Diabetes Father   . Pneumonia Paternal Grandfather   . Lung cancer Maternal Grandmother   . Pneumonia Maternal Grandfather     Social History Social History   Tobacco Use  . Smoking status: Never Smoker  . Smokeless tobacco: Never Used  Substance Use Topics  . Alcohol use: Yes    Frequency: Never    Comment: social   . Drug use: Never     Allergies   Patient has no known allergies.   Review of Systems Review of Systems  Constitutional: Positive for fatigue. Negative for activity change and appetite change.   HENT: Negative for congestion, ear discharge, ear pain, facial swelling, nosebleeds, rhinorrhea, sneezing and tinnitus.   Eyes: Negative for photophobia, pain and discharge.  Respiratory: Positive for chest tightness and shortness of breath. Negative for cough, choking and wheezing.   Cardiovascular: Negative for chest pain, palpitations and leg swelling.  Gastrointestinal: Negative for abdominal pain, blood in stool, constipation, diarrhea, nausea and vomiting.  Genitourinary: Negative for difficulty urinating, dysuria, flank pain, frequency and hematuria.  Musculoskeletal: Negative for back pain, gait problem, myalgias and neck pain.  Skin: Negative for color change, rash and wound.  Neurological: Negative for dizziness, seizures, syncope, facial asymmetry, speech difficulty, weakness and numbness.  Hematological: Negative for adenopathy. Does not bruise/bleed easily.  Psychiatric/Behavioral: Negative for agitation, confusion, hallucinations, self-injury and suicidal ideas. The patient is not nervous/anxious.      Physical Exam Updated Vital Signs BP 133/89 (BP Location: Left Arm)   Pulse 93   Temp (!) 97.5 F (36.4 C) (Oral)   Resp 14   Ht 5\' 3"  (1.6 m)   Wt 92.5 kg   LMP 04/17/2019   SpO2 100%   BMI 36.14 kg/m   Physical Exam Vitals signs and nursing note reviewed.  Constitutional:      General: She is not in acute distress.    Appearance: She is well-developed.  HENT:     Head: Normocephalic and atraumatic.     Right Ear: External ear normal.     Left Ear: External ear normal.  Eyes:     General: No scleral icterus.       Right eye: No discharge.        Left eye: No discharge.     Conjunctiva/sclera: Conjunctivae normal.  Neck:     Musculoskeletal: Neck supple.     Trachea: No tracheal deviation.  Cardiovascular:     Rate and Rhythm: Normal rate and regular rhythm.  Pulmonary:     Effort: Pulmonary effort is normal. No respiratory distress.     Breath sounds:  Normal breath sounds. No stridor. No wheezing or rales.  Abdominal:     General: Bowel sounds are normal. There is no distension.     Palpations: Abdomen is soft.     Tenderness: There is no abdominal tenderness. There is no guarding or rebound.  Musculoskeletal:        General: No tenderness.  Skin:    General: Skin is warm and dry.     Findings: No rash.  Neurological:     Mental Status: She is alert.     Cranial Nerves: No cranial nerve deficit (no  facial droop, extraocular movements intact, no slurred speech).     Sensory: No sensory deficit.     Motor: No abnormal muscle tone or seizure activity.     Coordination: Coordination normal.      ED Treatments / Results  Labs (all labs ordered are listed, but only abnormal results are displayed) Labs Reviewed - No data to display  EKG EKG Interpretation  Date/Time:  Wednesday April 22 2019 10:30:09 EDT Ventricular Rate:  102 PR Interval:    QRS Duration: 82 QT Interval:  340 QTC Calculation: 443 R Axis:   78 Text Interpretation:  Sinus tachycardia Low voltage, precordial leads no acute ST/T changes No old tracing to compare Confirmed by Sherwood Gambler [phone]) on 04/22/2019 10:43:55 AM   Radiology No results found.  Procedures Procedures (including critical care time)  Medications Ordered in ED Medications - No data to display   Initial Impression / Assessment and Plan / ED Course  I have reviewed the triage vital signs and the nursing notes.  Pertinent labs & imaging results that were available during my care of the patient were reviewed by me and considered in my medical decision making (see chart for details).          Final Clinical Impressions(s) / ED Diagnoses MdM  Vital signs are nonacute.  Pulse oximetry is 100% on room air.  Within normal limits by my interpretation.  The patient states that she tested positive for COVID in August 2020.  Since that time she has had 2- COVID test.  In the last  week she has had headache, chest tightness, sensation of shortness of breath, and easy fatigue.  She presents now for evaluation of this issue.  Attempted to recheck the COVID-19 test.  Patient states that she could not tolerate the test today.  Comprehensive metabolic panel shows a glucose to be elevated at 273, otherwise within normal limits.  Troponin is negative.  Pregnancy test is negative.  Complete blood count is nonacute.  Urine analysis shows glucose of 500 with a large hemoglobin.  The patient states however that she is just finishing her menstrual cycle. Chest x-ray shows no active disease.  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  The patient will be given an albuterol inhaler as an attempt to test this for assistance with her breathing and sensation of shortness of breath.  The patient has been 100% on room air during her emergency department visit.  She speaks in complete sentences without problem.  There is been no temperature elevation noted.  The patient is advised to increase water and fluids.  To monitor glucose very closely.  She has been given instructions on using the albuterol inhaler, and is advised to return if any changes in her condition, problems or concerns.  Have asked her to see Dr. Lajuana Ripple in the office as soon as possible for additional follow-up and management.   Final diagnoses:  Dyspnea, unspecified type    ED Discharge Orders    None       Lily Kocher, PA-C 04/22/19 1508    Sherwood Gambler, MD 04/24/19 [phone]

## 2019-04-22 NOTE — Discharge Instructions (Signed)
Your vital signs have been reviewed.  Your oxygen level is 100% on room air.  Within normal limits by my interpretation.  Your chest x-ray is negative for pneumonia or other acute problems.  Your blood work shows your glucose to be elevated, otherwise your labs are nonacute.  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  Please use albuterol 2 puffs every 4 hours.  Please monitor your breathing in your activities closely.  Monitor your temperature closely.  Your sugar was elevated, please monitor your diet, exercise, and medications closely.  Please see your primary physician Dr. Lajuana Ripple as soon as possible.  Please return to the emergency department if your difficulty with breathing worsens, you have high fever, shortness of breath, worsening of symptoms, problems or concerns.

## 2019-04-22 NOTE — ED Triage Notes (Signed)
Pt reports that she had covid August 13. Pt says she had 2 negative covids before she could go back to work. Second neg 9/16/ Pt reports HA, SOB and chest tightness for almost 2 weeks. No coughing. 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

## 2019-04-22 NOTE — ED Notes (Signed)
RN attempted to perform Covid test on pt. Pt attempted but was unable to complete the test after multiple attempts. Pt requested no further attempts at Covid testing, stating, "I just can't do it".

## 2019-07-24 ENCOUNTER — Other Ambulatory Visit: Payer: Self-pay | Admitting: Family Medicine

## 2019-09-07 ENCOUNTER — Encounter: Payer: Self-pay | Admitting: Family Medicine

## 2019-09-07 ENCOUNTER — Ambulatory Visit (INDEPENDENT_AMBULATORY_CARE_PROVIDER_SITE_OTHER): Payer: Managed Care, Other (non HMO) | Admitting: Family Medicine

## 2019-09-07 ENCOUNTER — Other Ambulatory Visit: Payer: Self-pay

## 2019-09-07 VITALS — BP 128/72 | Wt 206.0 lb

## 2019-09-07 DIAGNOSIS — E785 Hyperlipidemia, unspecified: Secondary | ICD-10-CM

## 2019-09-07 DIAGNOSIS — I10 Essential (primary) hypertension: Secondary | ICD-10-CM

## 2019-09-07 DIAGNOSIS — E1169 Type 2 diabetes mellitus with other specified complication: Secondary | ICD-10-CM | POA: Diagnosis not present

## 2019-09-07 DIAGNOSIS — R5383 Other fatigue: Secondary | ICD-10-CM

## 2019-09-07 DIAGNOSIS — E1159 Type 2 diabetes mellitus with other circulatory complications: Secondary | ICD-10-CM

## 2019-09-07 DIAGNOSIS — E119 Type 2 diabetes mellitus without complications: Secondary | ICD-10-CM

## 2019-09-07 MED ORDER — TRIAMTERENE-HCTZ 37.5-25 MG PO CAPS: 1.0000 | ORAL_CAPSULE | Freq: Every day | ORAL | 2 refills | Status: DC

## 2019-09-07 MED ORDER — GLIPIZIDE ER 5 MG PO TB24: 5.0000 mg | ORAL_TABLET | Freq: Every day | ORAL | 2 refills | Status: DC

## 2019-09-07 MED ORDER — METFORMIN HCL 1000 MG PO TABS: 1000.0000 mg | ORAL_TABLET | Freq: Two times a day (BID) | ORAL | 2 refills | Status: DC

## 2019-09-07 NOTE — Patient Instructions (Signed)
Get your diabetic eye exam done.  Try to come in fasting for labs.

## 2019-09-07 NOTE — Progress Notes (Signed)
Telephone visit  Subjective: CC: med refills PCP: Janora Norlander, DO TDV:[id number] Mcclintic is a 39 y.o. female calls for telephone consult today. Patient provides verbal consent for consult held via phone.  Due to COVID-19 pandemic this visit was conducted virtually. This visit type was conducted due to national recommendations for restrictions regarding the COVID-19 Pandemic (e.g. social distancing, sheltering in place) in an effort to limit this patient's exposure and mitigate transmission in our community. All issues noted in this document were discussed and addressed.  A physical exam was not performed with this format.   Location of patient: home Location of provider: Working remotely from home Others present for call: none  1. DM2 w/ HTN, HLD Patient reports that she has been out of her medication for blood sugar for the last week.  She notes that her blood sugars have been elevated.  She is compliant with antihypertensive and cholesterol medicine.  Her blood pressures have remained in normal range.  Of note, she had A1c of greater than 10 in October 2019, which was the last time we saw each other for diabetes.  At that time, glipizide was added because she was having severe side effects from Trulicity.  However, she notes that blood sugars have remained elevated and she is interested in potentially starting Ozempic, which a family member takes.  Does not report any chest pains, shortness of breath, changes in vision.  She notes that she did have some dyspnea with Covid infection in August. She has not yet gotten the Covid vaccine.  She is waiting on this.   ROS: Per HPI  No Known Allergies Past Medical History:  Diagnosis Date  . Allergy   . Diabetes mellitus without complication ([place])   . Hypertension   . Trichimoniasis 01/06/2018   Treated 6/24, POC ____    Current Outpatient Medications:  .  atorvastatin (LIPITOR) 20 MG tablet, Take 1 tablet (20 mg total) by mouth daily.,  Disp: 30 tablet, Rfl: 12 .  cyclobenzaprine (FLEXERIL) 5 MG tablet, Take 1 tablet (5 mg total) by mouth 3 (three) times daily as needed for muscle spasms., Disp: 30 tablet, Rfl: 0 .  glipiZIDE (GLUCOTROL XL) 5 MG 24 hr tablet, Take 1 tablet (5 mg total) by mouth daily with breakfast. (Needs to be seen before next refill), Disp: 30 tablet, Rfl: 0 .  metFORMIN (GLUCOPHAGE) 1000 MG tablet, Take 1 tablet (1,000 mg total) by mouth 2 (two) times daily with a meal. (Needs to be seen before next refill), Disp: 60 tablet, Rfl: 0 .  naproxen (NAPROSYN) 500 MG tablet, Take 1 tablet (500 mg total) by mouth 2 (two) times daily with a meal. (if needed for pain), Disp: 30 tablet, Rfl: 0 .  triamterene-hydrochlorothiazide (DYAZIDE) 37.5-25 MG capsule, Take 1 each (1 capsule total) by mouth daily. (Needs to be seen before next refill), Disp: 30 capsule, Rfl: 0  Blood pressure 128/72, weight 206 lb (93.4 kg), last menstrual period 08/30/2019.  Assessment/ Plan: 39 y.o. female   1. Diabetes mellitus without complication (HCC) Last X1G >10.0 in 04/2018.  Needs urine micro, DM eye exam and foot exam.  Plan to start Ozempic if her blood sugar remains elevated.  We discussed the remote possibility that if she is able achieve good control of her blood sugars with Ozempic we may be able to get her off the glipizide and maintain with only Metformin and Ozempic.  She seemed amenable to this plan.  We will plan to give her  samples pending her A1c on Wednesday. - glipiZIDE (GLUCOTROL XL) 5 MG 24 hr tablet; Take 1 tablet (5 mg total) by mouth daily with breakfast.  Dispense: 30 tablet; Refill: 2 - metFORMIN (GLUCOPHAGE) 1000 MG tablet; Take 1 tablet (1,000 mg total) by mouth 2 (two) times daily with a meal.  Dispense: 60 tablet; Refill: 2 - CMP14+EGFR; Future - Microalbumin / creatinine urine ratio; Future  2. Hyperlipidemia associated with type 2 diabetes mellitus ([place]) Needs fasting labs - CMP14+EGFR; Future - Lipid  panel; Future - TSH; Future  3. Hypertension associated with diabetes ([place]) Controlled. - triamterene-hydrochlorothiazide (DYAZIDE) 37.5-25 MG capsule; Take 1 each (1 capsule total) by mouth daily.  Dispense: 30 capsule; Refill: 2  4. Morbid obesity ([place]) - TSH; Future  5. Other fatigue - CBC; Future - TSH; Future   Start time: 2:02pm End time:2:15 pm  Total time spent on patient care (including telephone call/ virtual visit): 22 minutes  [place], [place] [phone]

## 2019-09-09 ENCOUNTER — Other Ambulatory Visit: Payer: Managed Care, Other (non HMO)

## 2019-09-09 ENCOUNTER — Other Ambulatory Visit: Payer: Self-pay

## 2019-09-09 DIAGNOSIS — E119 Type 2 diabetes mellitus without complications: Secondary | ICD-10-CM

## 2019-09-09 DIAGNOSIS — R5383 Other fatigue: Secondary | ICD-10-CM

## 2019-09-09 DIAGNOSIS — E785 Hyperlipidemia, unspecified: Secondary | ICD-10-CM

## 2019-09-09 DIAGNOSIS — E1169 Type 2 diabetes mellitus with other specified complication: Secondary | ICD-10-CM

## 2019-09-10 ENCOUNTER — Other Ambulatory Visit: Payer: Self-pay | Admitting: Family Medicine

## 2019-09-10 DIAGNOSIS — E1165 Type 2 diabetes mellitus with hyperglycemia: Secondary | ICD-10-CM

## 2019-09-10 LAB — CMP14+EGFR
ALT: 27 IU/L (ref 0–32)
AST: 23 IU/L (ref 0–40)
Albumin/Globulin Ratio: 1.4 (ref 1.2–2.2)
Albumin: 4.4 g/dL (ref 3.8–4.8)
Alkaline Phosphatase: 111 IU/L (ref 39–117)
BUN/Creatinine Ratio: 10 (ref 9–23)
BUN: 9 mg/dL (ref 6–20)
Bilirubin Total: <0.2 mg/dL (ref 0.0–1.2)
CO2: 20 mmol/L (ref 20–29)
Calcium: 9.6 mg/dL (ref 8.7–10.2)
Chloride: 93 mmol/L — ABNORMAL LOW (ref 96–106)
Creatinine, Ser: 0.87 mg/dL (ref 0.57–1.00)
GFR calc Af Amer: 98 mL/min/{1.73_m2} (ref >59)
GFR calc non Af Amer: 85 mL/min/{1.73_m2} (ref >59)
Globulin, Total: 3.1 g/dL (ref 1.5–4.5)
Glucose: 468 mg/dL (ref 65–99)
Potassium: 4.3 mmol/L (ref 3.5–5.2)
Sodium: 132 mmol/L — ABNORMAL LOW (ref 134–144)
Total Protein: 7.5 g/dL (ref 6.0–8.5)

## 2019-09-10 LAB — CBC
Hematocrit: 43.9 % (ref 34.0–46.6)
Hemoglobin: 14.3 g/dL (ref 11.1–15.9)
MCH: 31 pg (ref 26.6–33.0)
MCHC: 32.6 g/dL (ref 31.5–35.7)
MCV: 95 fL (ref 79–97)
Platelets: 353 10*3/uL (ref 150–450)
RBC: 4.62 x10E6/uL (ref 3.77–5.28)
RDW: 12.5 % (ref 11.7–15.4)
WBC: 7.3 10*3/uL (ref 3.4–10.8)

## 2019-09-10 LAB — LIPID PANEL
Chol/HDL Ratio: 4.9 ratio — ABNORMAL HIGH (ref 0.0–4.4)
Cholesterol, Total: 227 mg/dL — ABNORMAL HIGH (ref 100–199)
HDL: 46 mg/dL (ref >39)
LDL Chol Calc (NIH): 134 mg/dL — ABNORMAL HIGH (ref 0–99)
Triglycerides: 262 mg/dL — ABNORMAL HIGH (ref 0–149)
VLDL Cholesterol Cal: 47 mg/dL — ABNORMAL HIGH (ref 5–40)

## 2019-09-10 LAB — TSH: TSH: 1.46 u[IU]/mL (ref 0.450–4.500)

## 2019-09-10 LAB — MICROALBUMIN / CREATININE URINE RATIO
Creatinine, Urine: 35.3 mg/dL
Microalb/Creat Ratio: 26 mg/g creat (ref 0–29)
Microalbumin, Urine: 9.2 ug/mL

## 2019-09-10 MED ORDER — OZEMPIC (0.25 OR 0.5 MG/DOSE) 2 MG/1.5ML ~~LOC~~ SOPN: 0.5000 mg | PEN_INJECTOR | SUBCUTANEOUS | 2 refills | Status: DC

## 2019-09-14 LAB — HGB A1C W/O EAG: Hgb A1c MFr Bld: 12.4 % — ABNORMAL HIGH (ref 4.8–5.6)

## 2019-09-14 LAB — SPECIMEN STATUS REPORT

## 2020-01-20 ENCOUNTER — Other Ambulatory Visit: Payer: Self-pay | Admitting: Family Medicine

## 2020-01-20 DIAGNOSIS — E1159 Type 2 diabetes mellitus with other circulatory complications: Secondary | ICD-10-CM

## 2020-01-20 DIAGNOSIS — E119 Type 2 diabetes mellitus without complications: Secondary | ICD-10-CM

## 2020-01-21 NOTE — Telephone Encounter (Signed)
Last OV 09/07/19. Last RF 09/07/19. Next OV not scheduled  30 day supply given   ntbs for further refills

## 2020-03-14 ENCOUNTER — Ambulatory Visit (INDEPENDENT_AMBULATORY_CARE_PROVIDER_SITE_OTHER): Payer: Managed Care, Other (non HMO) | Admitting: Family Medicine

## 2020-03-14 ENCOUNTER — Other Ambulatory Visit: Payer: Self-pay

## 2020-03-14 ENCOUNTER — Encounter: Payer: Self-pay | Admitting: Family Medicine

## 2020-03-14 VITALS — BP 126/85 | HR 88 | Temp 97.1°F | Ht 63.0 in | Wt 204.0 lb

## 2020-03-14 DIAGNOSIS — E1169 Type 2 diabetes mellitus with other specified complication: Secondary | ICD-10-CM

## 2020-03-14 DIAGNOSIS — E1165 Type 2 diabetes mellitus with hyperglycemia: Secondary | ICD-10-CM | POA: Diagnosis not present

## 2020-03-14 DIAGNOSIS — E785 Hyperlipidemia, unspecified: Secondary | ICD-10-CM

## 2020-03-14 DIAGNOSIS — E1159 Type 2 diabetes mellitus with other circulatory complications: Secondary | ICD-10-CM

## 2020-03-14 DIAGNOSIS — E119 Type 2 diabetes mellitus without complications: Secondary | ICD-10-CM

## 2020-03-14 DIAGNOSIS — I10 Essential (primary) hypertension: Secondary | ICD-10-CM

## 2020-03-14 LAB — BAYER DCA HB A1C WAIVED: HB A1C (BAYER DCA - WAIVED): >14 % — ABNORMAL HIGH (ref <7.0)

## 2020-03-14 MED ORDER — METFORMIN HCL 1000 MG PO TABS: 1000.0000 mg | ORAL_TABLET | Freq: Two times a day (BID) | ORAL | 3 refills | Status: DC

## 2020-03-14 MED ORDER — GLIPIZIDE ER 5 MG PO TB24: ORAL_TABLET | ORAL | 3 refills | Status: DC

## 2020-03-14 MED ORDER — ATORVASTATIN CALCIUM 20 MG PO TABS: 20.0000 mg | ORAL_TABLET | Freq: Every day | ORAL | 3 refills | Status: DC

## 2020-03-14 NOTE — Patient Instructions (Addendum)
Have eye exam performed and results sent to me.  Call each month for Ozempic until you are able to get your insurance switched.  We can give you samples to bridge you.  You had labs performed today.  You will be contacted with the results of the labs once they are available, usually in the next 3 business days for routine lab work.  If you have an active my chart account, they will be released to your MyChart.  If you prefer to have these labs released to you via telephone, please let us know.  If you had a pap smear or biopsy performed, expect to be contacted in about 7-10 days.  Diabetes Mellitus and Standards of Medical Care Managing diabetes (diabetes mellitus) can be complicated. Your diabetes treatment may be managed by a team of health care providers, including:  A physician who specializes in diabetes (endocrinologist).  A nurse practitioner or physician assistant.  Nurses.  A diet and nutrition specialist (registered dietitian).  A certified diabetes educator (CDE).  An exercise specialist.  A pharmacist.  An eye doctor.  A foot specialist (podiatrist).  A dentist.  A primary care provider.  A mental health provider. Your health care providers follow guidelines to help you get the best quality of care. The following schedule is a general guideline for your diabetes management plan. Your health care providers may give you more specific instructions. Physical exams Upon being diagnosed with diabetes mellitus, and each year after that, your health care provider will ask about your medical and family history. He or she will also do a physical exam. Your exam may include:  Measuring your height, weight, and body mass index (BMI).  Checking your blood pressure. This will be done at every routine medical visit. Your target blood pressure may vary depending on your medical conditions, your age, and other factors.  Thyroid gland exam.  Skin exam.  Screening for damage to  your nerves (peripheral neuropathy). This may include checking the pulse in your legs and feet and checking the level of sensation in your hands and feet.  A complete foot exam to inspect the structure and skin of your feet, including checking for cuts, bruises, redness, blisters, sores, or other problems.  Screening for blood vessel (vascular) problems, which may include checking the pulse in your legs and feet and checking your temperature. Blood tests Depending on your treatment plan and your personal needs, you may have the following tests done:  HbA1c (hemoglobin A1c). This test provides information about blood sugar (glucose) control over the previous 2-3 months. It is used to adjust your treatment plan, if needed. This test will be done: ? At least 2 times a year, if you are meeting your treatment goals. ? 4 times a year, if you are not meeting your treatment goals or if treatment goals have changed.  Lipid testing, including total, LDL, and HDL cholesterol and triglyceride levels. ? The goal for LDL is less than 100 mg/dL (5.5 mmol/L). If you are at high risk for complications, the goal is less than 70 mg/dL (3.9 mmol/L). ? The goal for HDL is 40 mg/dL (2.2 mmol/L) or higher for men and 50 mg/dL (2.8 mmol/L) or higher for women. An HDL cholesterol of 60 mg/dL (3.3 mmol/L) or higher gives some protection against heart disease. ? The goal for triglycerides is less than 150 mg/dL (8.3 mmol/L).  Liver function tests.  Kidney function tests.  Thyroid function tests. Dental and eye exams  Visit  your dentist two times a year.  If you have type 1 diabetes, your health care provider may recommend an eye exam 3-5 years after you are diagnosed, and then once a year after your first exam. ? For children with type 1 diabetes, a health care provider may recommend an eye exam when your child is age 95 or older and has had diabetes for 3-5 years. After the first exam, your child should get an eye  exam once a year.  If you have type 2 diabetes, your health care provider may recommend an eye exam as soon as you are diagnosed, and then once a year after your first exam. Immunizations   The yearly flu (influenza) vaccine is recommended for everyone 6 months or older who has diabetes.  The pneumonia (pneumococcal) vaccine is recommended for everyone 2 years or older who has diabetes. If you are 48 or older, you may get the pneumonia vaccine as a series of two separate shots.  The hepatitis B vaccine is recommended for adults shortly after being diagnosed with diabetes.  Adults and children with diabetes should receive all other vaccines according to age-specific recommendations from the Centers for Disease Control and Prevention (CDC). Mental and emotional health Screening for symptoms of eating disorders, anxiety, and depression is recommended at the time of diagnosis and afterward as needed. If your screening shows that you have symptoms (positive screening result), you may need more evaluation and you may work with a mental health care provider. Treatment plan Your treatment plan will be reviewed at every medical visit. You and your health care provider will discuss:  How you are taking your medicines, including insulin.  Any side effects you are experiencing.  Your blood glucose target goals.  The frequency of your blood glucose monitoring.  Lifestyle habits, such as activity level as well as tobacco, alcohol, and substance use. Diabetes self-management education Your health care provider will assess how well you are monitoring your blood glucose levels and whether you are taking your insulin correctly. He or she may refer you to:  A certified diabetes educator to manage your diabetes throughout your life, starting at diagnosis.  A registered dietitian who can create or review your personal nutrition plan.  An exercise specialist who can discuss your activity level and  exercise plan. Summary  Managing diabetes (diabetes mellitus) can be complicated. Your diabetes treatment may be managed by a team of health care providers.  Your health care providers follow guidelines in order to help you get the best quality of care.  Standards of care including having regular physical exams, blood tests, blood pressure monitoring, immunizations, screening tests, and education about how to manage your diabetes.  Your health care providers may also give you more specific instructions based on your individual health. This information is not intended to replace advice given to you by your health care provider. Make sure you discuss any questions you have with your health care provider. Document Revised: 03/21/2018 Document Reviewed: 03/30/2016 Elsevier Patient Education  2020 [employer].

## 2020-03-14 NOTE — Progress Notes (Signed)
Subjective: CC: f/u uncontrolled DM PCP: Janora Norlander, DO XVQ:[id number] Fulp is a 39 y.o. female presenting to clinic today for:  1. Type 2 Diabetes w/ HTN, HLD:  Patient was last seen via video visit in February.  At that time her A1c was uncontrolled.  She was started on Ozempic.  She was continued on Metformin and glipizide.  She was to continue her triamterene-hydrochlorothiazide and Lipitor.  She unfortunately has been lost to follow-up.  She notes that the Ozempic was not affordable because of high deductible insurance plan.  She is only been taking the Metformin and glipizide.  She did tolerate the Ozempic without difficulty however.  Does not monitor blood sugars.  Last eye exam: needs Last foot exam: needs Last A1c:  Lab Results  Component Value Date   HGBA1C 12.4 (H) 09/09/2019   Nephropathy screen indicated?: UTD 09/09/2019 Last flu, zoster and/or pneumovax: Needs PNA vaccine   There is no immunization history on file for this patient.  ROS: Denies dizziness, LOC, polyuria, polydipsia, unintended weight loss/gain, foot ulcerations, numbness or tingling in extremities, shortness of breath or chest pain.    ROS: Per HPI  No Known Allergies Past Medical History:  Diagnosis Date  . Allergy   . Diabetes mellitus without complication ([place])   . Hypertension   . Trichimoniasis 01/06/2018   Treated 6/24, POC ____    Current Outpatient Medications:  .  atorvastatin (LIPITOR) 20 MG tablet, Take 1 tablet (20 mg total) by mouth daily., Disp: 30 tablet, Rfl: 12 .  cyclobenzaprine (FLEXERIL) 5 MG tablet, Take 1 tablet (5 mg total) by mouth 3 (three) times daily as needed for muscle spasms., Disp: 30 tablet, Rfl: 0 .  glipiZIDE (GLUCOTROL XL) 5 MG 24 hr tablet, Take 1 tablet by mouth once daily with breakfast, Disp: 30 tablet, Rfl: 0 .  metFORMIN (GLUCOPHAGE) 1000 MG tablet, TAKE 1 TABLET BY MOUTH TWICE DAILY WITH MEALS, Disp: 60 tablet, Rfl: 0 .  naproxen (NAPROSYN) 500 MG  tablet, Take 1 tablet (500 mg total) by mouth 2 (two) times daily with a meal. (if needed for pain), Disp: 30 tablet, Rfl: 0 .  Semaglutide,0.25 or 0.5MG /DOS, (OZEMPIC, 0.25 OR 0.5 MG/DOSE,) 2 MG/1.5ML SOPN, Inject 0.5 mg into the skin every 7 (seven) days., Disp: 1.5 mL, Rfl: 2 .  triamterene-hydrochlorothiazide (DYAZIDE) 37.5-25 MG capsule, Take 1 capsule by mouth once daily, Disp: 30 capsule, Rfl: 0 Social History   Socioeconomic History  . Marital status: Single    Spouse name: Not on file  . Number of children: Not on file  . Years of education: Not on file  . Highest education level: Not on file  Occupational History  . Not on file  Tobacco Use  . Smoking status: Never Smoker  . Smokeless tobacco: Never Used  Vaping Use  . Vaping Use: Never used  Substance and Sexual Activity  . Alcohol use: Yes    Comment: social   . Drug use: Never  . Sexual activity: Yes    Birth control/protection: None  Other Topics Concern  . Not on file  Social History Narrative  . Not on file   Social Determinants of Health   Financial Resource Strain:   . Difficulty of Paying Living Expenses: Not on file  Food Insecurity:   . Worried About Charity fundraiser in the Last Year: Not on file  . Ran Out of Food in the Last Year: Not on file  Transportation Needs:   .  Lack of Transportation (Medical): Not on file  . Lack of Transportation (Non-Medical): Not on file  Physical Activity:   . Days of Exercise per Week: Not on file  . Minutes of Exercise per Session: Not on file  Stress:   . Feeling of Stress : Not on file  Social Connections:   . Frequency of Communication with Friends and Family: Not on file  . Frequency of Social Gatherings with Friends and Family: Not on file  . Attends Religious Services: Not on file  . Active Member of Clubs or Organizations: Not on file  . Attends Archivist Meetings: Not on file  . Marital Status: Not on file  Intimate Partner Violence:   .  Fear of Current or Ex-Partner: Not on file  . Emotionally Abused: Not on file  . Physically Abused: Not on file  . Sexually Abused: Not on file   Family History  Problem Relation Age of Onset  . Diabetes Father   . Pneumonia Paternal Grandfather   . Lung cancer Maternal Grandmother   . Pneumonia Maternal Grandfather     Objective: Office vital signs reviewed. BP 126/85   Pulse 88   Temp (!) 97.1 F (36.2 C)   Ht $R'5\' 3"'na$  (1.6 m)   Wt 204 lb (92.5 kg)   SpO2 97%   BMI 36.14 kg/m   Physical Examination:  General: Awake, alert, well nourished, No acute distress HEENT: Normal, sclera white, MMM Cardio: regular rate and rhythm, S1S2 heard, no murmurs appreciated Pulm: clear to auscultation bilaterally, no wheezes, rhonchi or rales; normal work of breathing on room air Extremities: warm, well perfused, No edema, cyanosis or clubbing; +2 pulses bilaterally MSK: normla gait and station Skin: dry; intact; no rashes or lesions Neuro: see DM foot Diabetic Foot Exam - Simple   Simple Foot Form Diabetic Foot exam was performed with the following findings: Yes 03/14/2020  5:29 PM  Visual Inspection No deformities, no ulcerations, no other skin breakdown bilaterally: Yes Sensation Testing Intact to touch and monofilament testing bilaterally: Yes Pulse Check Posterior Tibialis and Dorsalis pulse intact bilaterally: Yes Comments     Assessment/ Plan: 39 y.o. female   Uncontrolled type 2 diabetes mellitus with hyperglycemia (HCC) - Plan: Bayer DCA Hb A1c Waived, CMP14+EGFR  Hyperlipidemia associated with type 2 diabetes mellitus (HCC) - Plan: CMP14+EGFR, LDL Cholesterol, Direct  Hypertension associated with diabetes ([place]) - Plan: CMP14+EGFR  Morbid obesity ([place])  Grossly uncontrolled A1c over 14.  Start back on Ozempic.  Sample provided.  She can call back each month for bridge supply until she is able to switch her insurance.  I would like to see her back in 3 months for recheck  blood sugars.  I have renewed her glipizide and Metformin.  Orders Placed This Encounter  Procedures  . Bayer DCA Hb A1c Waived  . CMP14+EGFR  . LDL Cholesterol, Direct   Meds ordered this encounter  Medications  . glipiZIDE (GLUCOTROL XL) 5 MG 24 hr tablet    Sig: Take 1 tablet by mouth once daily with breakfast    Dispense:  90 tablet    Refill:  3  . metFORMIN (GLUCOPHAGE) 1000 MG tablet    Sig: Take 1 tablet (1,000 mg total) by mouth 2 (two) times daily with a meal.    Dispense:  180 tablet    Refill:  3  . atorvastatin (LIPITOR) 20 MG tablet    Sig: Take 1 tablet (20 mg total) by mouth daily.  Dispense:  90 tablet    Refill:  [place], [place] [phone]

## 2020-03-15 LAB — CMP14+EGFR
ALT: 23 IU/L (ref 0–32)
AST: 24 IU/L (ref 0–40)
Albumin/Globulin Ratio: 1.4 (ref 1.2–2.2)
Albumin: 3.8 g/dL (ref 3.8–4.8)
Alkaline Phosphatase: 111 IU/L (ref 48–121)
BUN/Creatinine Ratio: 12 (ref 9–23)
BUN: 9 mg/dL (ref 6–20)
Bilirubin Total: <0.2 mg/dL (ref 0.0–1.2)
CO2: 23 mmol/L (ref 20–29)
Calcium: 8.7 mg/dL (ref 8.7–10.2)
Chloride: 95 mmol/L — ABNORMAL LOW (ref 96–106)
Creatinine, Ser: 0.75 mg/dL (ref 0.57–1.00)
GFR calc Af Amer: 116 mL/min/{1.73_m2} (ref >59)
GFR calc non Af Amer: 101 mL/min/{1.73_m2} (ref >59)
Globulin, Total: 2.8 g/dL (ref 1.5–4.5)
Glucose: 525 mg/dL (ref 65–99)
Potassium: 3.8 mmol/L (ref 3.5–5.2)
Sodium: 132 mmol/L — ABNORMAL LOW (ref 134–144)
Total Protein: 6.6 g/dL (ref 6.0–8.5)

## 2020-03-15 LAB — LDL CHOLESTEROL, DIRECT: LDL Direct: 126 mg/dL — ABNORMAL HIGH (ref 0–99)

## 2020-04-18 ENCOUNTER — Telehealth: Payer: Self-pay

## 2020-04-20 NOTE — Telephone Encounter (Signed)
Are you working on something for her or do I just need to get her samples?

## 2020-04-20 NOTE — Telephone Encounter (Signed)
I think she just needs samples, but we are out.  Should have some come in next week Would encourage patient to pick up at pharmacy  There are $25 copay cards online that patient can activate.  I have not worked with this patient.

## 2020-04-20 NOTE — Telephone Encounter (Signed)
Patient aware and verbalized understanding. °

## 2020-04-26 ENCOUNTER — Telehealth: Payer: Self-pay

## 2020-04-26 NOTE — Telephone Encounter (Signed)
1 sample available. Notified patient that sample would be ready for pick up today. Patient verbalized understanding

## 2020-05-08 IMAGING — CR DG CHEST 1V PORT
1 series · 1 of 1 positions shown · non-contrast
Comparison: None.

CLINICAL DATA: 2247F-UN positive February 2019 with shortness of
breath and chest tightness over the past 2 weeks.

EXAM:
PORTABLE CHEST 1 VIEW

[portable]
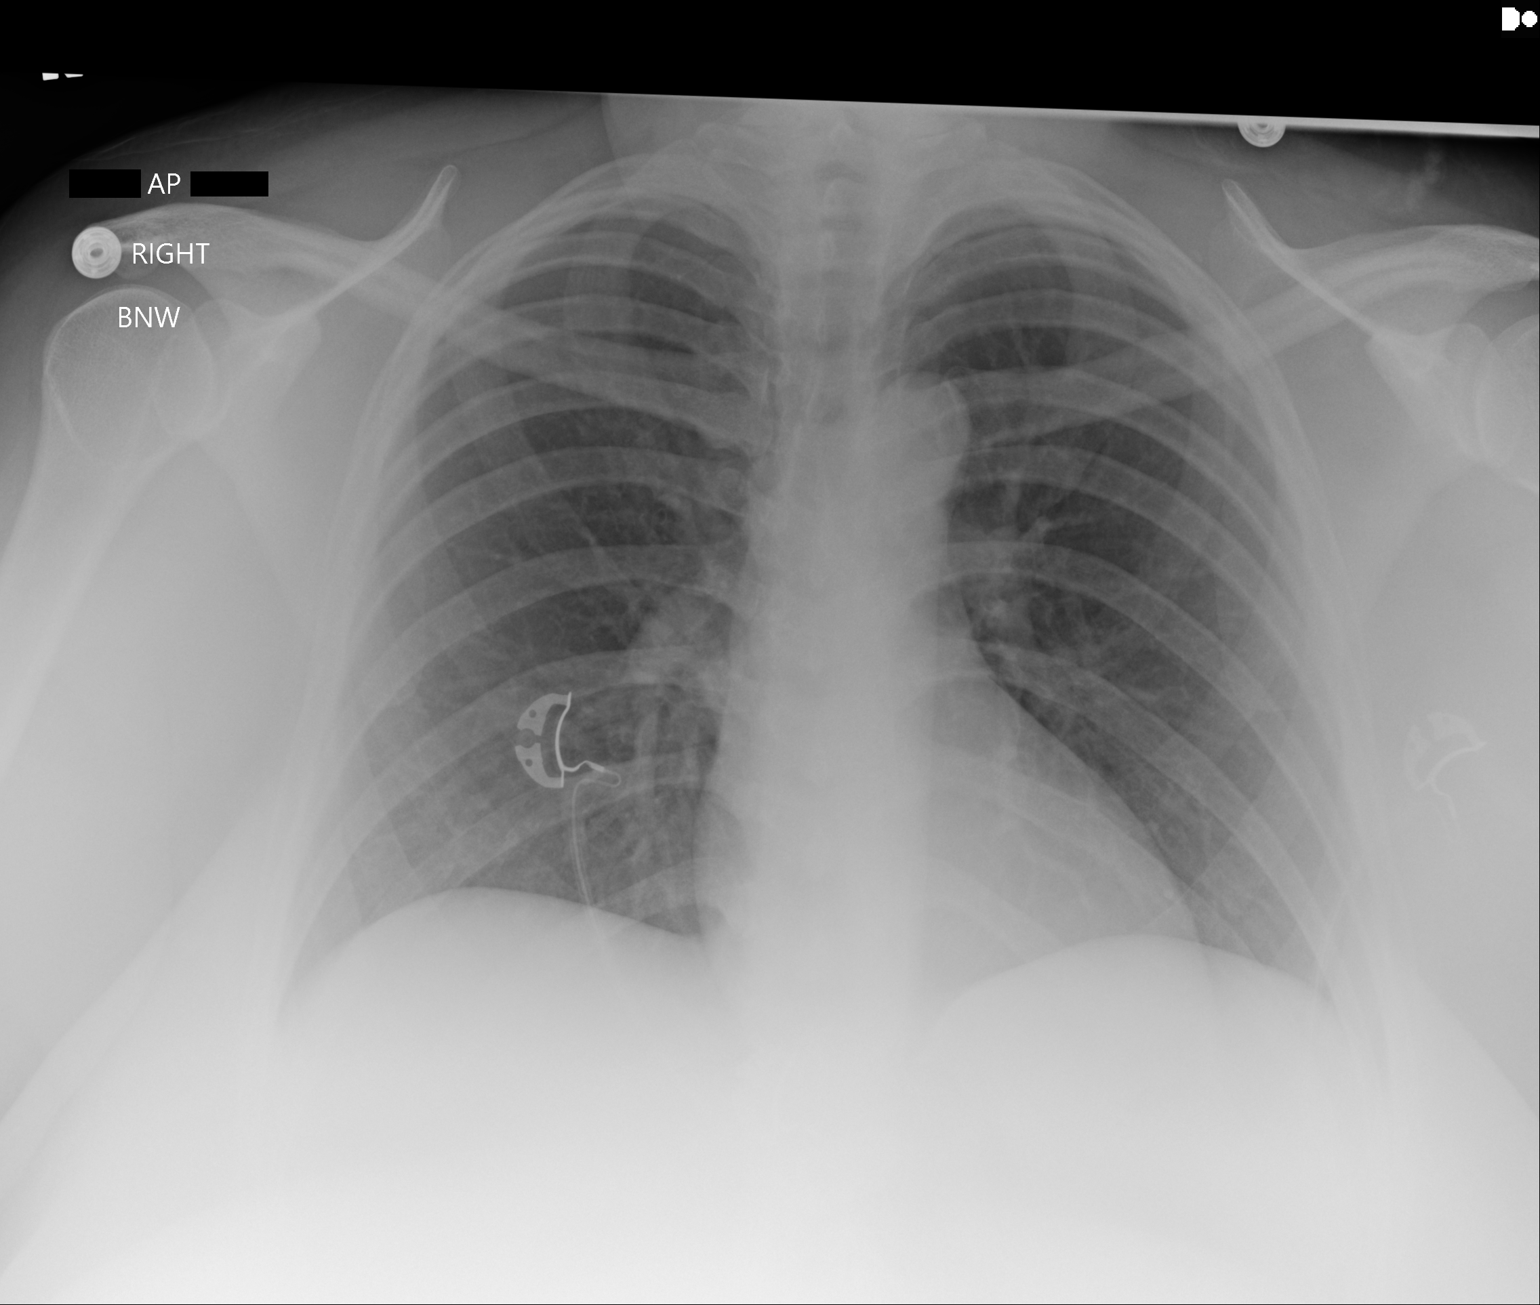

[1 of 1 positions shown; findings below may reference images not displayed]

FINDINGS: The heart size and mediastinal contours are within normal limits.
Both lungs are clear. Mild prominence of the soft tissues.
Underlying bony structures are normal.
IMPRESSION: No active disease.

## 2020-05-11 ENCOUNTER — Other Ambulatory Visit: Payer: Self-pay | Admitting: Family Medicine

## 2020-05-11 DIAGNOSIS — E1159 Type 2 diabetes mellitus with other circulatory complications: Secondary | ICD-10-CM

## 2020-05-11 DIAGNOSIS — I152 Hypertension secondary to endocrine disorders: Secondary | ICD-10-CM

## 2020-06-03 ENCOUNTER — Telehealth: Payer: Self-pay

## 2020-06-03 NOTE — Telephone Encounter (Signed)
No samples available, informed patient.  Scheduled 3 month follow up visit for patient.

## 2020-06-14 ENCOUNTER — Telehealth: Payer: Self-pay

## 2020-06-14 NOTE — Telephone Encounter (Signed)
No samples available, patient informed 

## 2020-06-28 ENCOUNTER — Ambulatory Visit: Payer: Managed Care, Other (non HMO) | Admitting: Family Medicine

## 2020-06-28 ENCOUNTER — Other Ambulatory Visit: Payer: Self-pay

## 2020-06-28 VITALS — BP 123/79 | HR 108 | Temp 97.6°F | Ht 63.0 in | Wt 203.0 lb

## 2020-06-28 DIAGNOSIS — E1165 Type 2 diabetes mellitus with hyperglycemia: Secondary | ICD-10-CM

## 2020-06-28 DIAGNOSIS — K219 Gastro-esophageal reflux disease without esophagitis: Secondary | ICD-10-CM

## 2020-06-28 DIAGNOSIS — I152 Hypertension secondary to endocrine disorders: Secondary | ICD-10-CM

## 2020-06-28 DIAGNOSIS — E1169 Type 2 diabetes mellitus with other specified complication: Secondary | ICD-10-CM | POA: Diagnosis not present

## 2020-06-28 DIAGNOSIS — E785 Hyperlipidemia, unspecified: Secondary | ICD-10-CM

## 2020-06-28 DIAGNOSIS — E1159 Type 2 diabetes mellitus with other circulatory complications: Secondary | ICD-10-CM

## 2020-06-28 LAB — BAYER DCA HB A1C WAIVED: HB A1C (BAYER DCA - WAIVED): 11.3 % — ABNORMAL HIGH (ref <7.0)

## 2020-06-28 MED ORDER — OMEPRAZOLE 20 MG PO CPDR: 20.0000 mg | DELAYED_RELEASE_CAPSULE | Freq: Every day | ORAL | 3 refills | Status: DC

## 2020-06-28 MED ORDER — ROSUVASTATIN CALCIUM 5 MG PO TABS: 5.0000 mg | ORAL_TABLET | Freq: Every day | ORAL | 3 refills | Status: DC

## 2020-06-28 MED ORDER — OZEMPIC (0.25 OR 0.5 MG/DOSE) 2 MG/1.5ML ~~LOC~~ SOPN: 0.5000 mg | PEN_INJECTOR | SUBCUTANEOUS | 12 refills | Status: DC

## 2020-06-28 NOTE — Progress Notes (Signed)
Subjective: CC:f/u uncontrolled DM PCP: Melissa Kelley, Melissa Kelley is a 39 y.o. female presenting to clinic today for:  1. Type 2 Diabetes with hypertension, hyperlipidemia:  Sugar was grossly uncontrolled at last visit.  A1c was over 14.  She was started back on glipizide XL 5 mg daily Metformin 1000 mg twice daily, Lipitor 20 mg daily and Ozempic.  Apparently called in November but there were no samples available.  A coupon card was recommended for the patient as this would bring down the cost $25.  Unfortunately the card did not really work with her insurance to bring down the cost and therefore she is just been without the use of but for the last month.  When she was on it she was tolerating it without difficulty and had got up to therapeutic dose of 0.5 mg each week.  She had no problems with GI issues with it.  She discontinued the atorvastatin because it was causing abdominal cramping.  She has a new insurance is coming in January and she would like to get back on this if possible.  Blood sugars during time of use were running around 150s.  Last eye exam: needs; she will schedule Last foot exam: UTD Last A1c:  Lab Results  Component Value Date   HGBA1C >14.0 (H) 03/14/2020   Nephropathy screen indicated?: UTD Last flu, zoster and/or pneumovax:  Immunization History  Administered Date(s) Administered  . PFIZER SARS-COV-2 Vaccination 03/25/2020, 04/15/2020    ROS: Denies dizziness, LOC,  unintended weight loss/gain, foot ulcerations, numbness or tingling in extremities, shortness of breath or chest pain. Reports polydipsia and polyuria  2.  GERD Patient reports onset of acid reflux over the last couple of months.  She had been using Rolaids and this is helped intermittently.  She obtained some Prilosec over-the-counter but only took it for a couple of days it did not find especially helpful.  No hematochezia or melena.  Sometimes the symptoms will radiate up into  her chest    ROS: Per HPI  No Known Allergies Past Medical History:  Diagnosis Date  . Allergy   . Diabetes mellitus without complication (HCC)   . Hypertension   . Trichimoniasis 01/06/2018   Treated 6/24, POC ____    Current Outpatient Medications:  .  atorvastatin (LIPITOR) 20 MG tablet, Take 1 tablet (20 mg total) by mouth daily., Disp: 90 tablet, Rfl: 3 .  cyclobenzaprine (FLEXERIL) 5 MG tablet, Take 1 tablet (5 mg total) by mouth 3 (three) times daily as needed for muscle spasms., Disp: 30 tablet, Rfl: 0 .  glipiZIDE (GLUCOTROL XL) 5 MG 24 hr tablet, Take 1 tablet by mouth once daily with breakfast, Disp: 90 tablet, Rfl: 3 .  metFORMIN (GLUCOPHAGE) 1000 MG tablet, Take 1 tablet (1,000 mg total) by mouth 2 (two) times daily with a meal., Disp: 180 tablet, Rfl: 3 .  naproxen (NAPROSYN) 500 MG tablet, Take 1 tablet (500 mg total) by mouth 2 (two) times daily with a meal. (if needed for pain), Disp: 30 tablet, Rfl: 0 .  Semaglutide,0.25 or 0.5MG /DOS, (OZEMPIC, 0.25 OR 0.5 MG/DOSE,) 2 MG/1.5ML SOPN, Inject 0.5 mg into the skin every 7 (seven) days., Disp: 1.5 mL, Rfl: 2 .  triamterene-hydrochlorothiazide (DYAZIDE) 37.5-25 MG capsule, Take 1 capsule by mouth once daily, Disp: 30 capsule, Rfl: 3 Social History   Socioeconomic History  . Marital status: Single    Spouse name: Not on file  . Number of children: Not on file  .  Years of education: Not on file  . Highest education level: Not on file  Occupational History  . Not on file  Tobacco Use  . Smoking status: Never Smoker  . Smokeless tobacco: Never Used  Vaping Use  . Vaping Use: Never used  Substance and Sexual Activity  . Alcohol use: Yes    Comment: social   . Drug use: Never  . Sexual activity: Yes    Birth control/protection: None  Other Topics Concern  . Not on file  Social History Narrative  . Not on file   Social Determinants of Health   Financial Resource Strain: Not on file  Food Insecurity: Not on  file  Transportation Needs: Not on file  Physical Activity: Not on file  Stress: Not on file  Social Connections: Not on file  Intimate Partner Violence: Not on file   Family History  Problem Relation Age of Onset  . Diabetes Father   . Pneumonia Paternal Grandfather   . Lung cancer Maternal Grandmother   . Pneumonia Maternal Grandfather     Objective: Office vital signs reviewed. BP 123/79   Pulse (!) 108   Temp 97.6 F (36.4 C) (Temporal)   Ht 5\' 3"  (1.6 m)   Wt 203 lb (92.1 kg)   LMP 06/11/2020   SpO2 97%   BMI 35.96 kg/m   Physical Examination:  General: Awake, alert, well nourished, No acute distress HEENT: Normal, sclera white, MMM Cardio: regular rate and rhythm, S1S2 heard, no murmurs appreciated Pulm: clear to auscultation bilaterally, no wheezes, rhonchi or rales; normal work of breathing on room air Extremities: warm, well perfused, No edema, cyanosis or clubbing; +2 pulses bilaterally MSK: normal gait and station Assessment/ Plan: 39 y.o. female   Uncontrolled type 2 diabetes mellitus with hyperglycemia (HCC) - Plan: Bayer DCA Hb A1c Waived, Semaglutide,0.25 or 0.5MG /DOS, (OZEMPIC, 0.25 OR 0.5 MG/DOSE,) 2 MG/1.5ML SOPN  Hyperlipidemia associated with type 2 diabetes mellitus (HCC) - Plan: rosuvastatin (CRESTOR) 5 MG tablet  Hypertension associated with diabetes (HCC) - Plan: Basic Metabolic Panel  Morbid obesity (HCC)  Gastroesophageal reflux disease without esophagitis - Plan: omeprazole (PRILOSEC) 20 MG capsule  Improving but not at goal.  A1c down to 11.3. I given her sample of Ozempic to get her through until January.  Future order sent to pharmacy to file under her new insurance at the beginning of the new year.  She is to continue monitoring blood sugars and follow-up with me in 3 months  I have switched her statin to Crestor.  BP controlled.  GERD not controlled by OTC medications.  Trial of PPI for 2 weeks.  May take up to 1 month.  Would  discontinue and then trial off of medicine to see if symptoms recur.  Orders Placed This Encounter  Procedures  . Bayer DCA Hb A1c Waived  . Basic Metabolic Panel   Meds ordered this encounter  Medications  . rosuvastatin (CRESTOR) 5 MG tablet    Sig: Take 1 tablet (5 mg total) by mouth daily.    Dispense:  90 tablet    Refill:  3  . Semaglutide,0.25 or 0.5MG /DOS, (OZEMPIC, 0.25 OR 0.5 MG/DOSE,) 2 MG/1.5ML SOPN    Sig: Inject 0.5 mg into the skin every 7 (seven) days. Melissa NOT fill until January (waiting on new ins)    Dispense:  1.5 mL    Refill:  12  . omeprazole (PRILOSEC) 20 MG capsule    Sig: Take 1 capsule (20 mg total)  by mouth daily. For GERD    Dispense:  30 capsule    Refill:  3     Melissa Wojciak Hulen Skains, Melissa [facility] [phone]

## 2020-06-29 LAB — BASIC METABOLIC PANEL
BUN/Creatinine Ratio: 14 (ref 9–23)
BUN: 12 mg/dL (ref 6–20)
CO2: 22 mmol/L (ref 20–29)
Calcium: 9 mg/dL (ref 8.7–10.2)
Chloride: 95 mmol/L — ABNORMAL LOW (ref 96–106)
Creatinine, Ser: 0.83 mg/dL (ref 0.57–1.00)
GFR calc Af Amer: 103 mL/min/{1.73_m2} (ref >59)
GFR calc non Af Amer: 89 mL/min/{1.73_m2} (ref >59)
Glucose: 543 mg/dL (ref 65–99)
Potassium: 4.2 mmol/L (ref 3.5–5.2)
Sodium: 132 mmol/L — ABNORMAL LOW (ref 134–144)

## 2020-09-26 ENCOUNTER — Ambulatory Visit (INDEPENDENT_AMBULATORY_CARE_PROVIDER_SITE_OTHER): Payer: Managed Care, Other (non HMO) | Admitting: Family Medicine

## 2020-09-26 ENCOUNTER — Other Ambulatory Visit: Payer: Self-pay

## 2020-09-26 VITALS — BP 125/85 | HR 101 | Temp 97.5°F | Ht 63.0 in | Wt 201.0 lb

## 2020-09-26 DIAGNOSIS — E785 Hyperlipidemia, unspecified: Secondary | ICD-10-CM

## 2020-09-26 DIAGNOSIS — I152 Hypertension secondary to endocrine disorders: Secondary | ICD-10-CM

## 2020-09-26 DIAGNOSIS — E1169 Type 2 diabetes mellitus with other specified complication: Secondary | ICD-10-CM

## 2020-09-26 DIAGNOSIS — R142 Eructation: Secondary | ICD-10-CM

## 2020-09-26 DIAGNOSIS — E1165 Type 2 diabetes mellitus with hyperglycemia: Secondary | ICD-10-CM

## 2020-09-26 DIAGNOSIS — E1159 Type 2 diabetes mellitus with other circulatory complications: Secondary | ICD-10-CM | POA: Diagnosis not present

## 2020-09-26 DIAGNOSIS — M21611 Bunion of right foot: Secondary | ICD-10-CM | POA: Diagnosis not present

## 2020-09-26 DIAGNOSIS — K219 Gastro-esophageal reflux disease without esophagitis: Secondary | ICD-10-CM

## 2020-09-26 LAB — BAYER DCA HB A1C WAIVED: HB A1C (BAYER DCA - WAIVED): 8 % — ABNORMAL HIGH (ref <7.0)

## 2020-09-26 MED ORDER — OZEMPIC (1 MG/DOSE) 4 MG/3ML ~~LOC~~ SOPN: 1.0000 mg | PEN_INJECTOR | SUBCUTANEOUS | 99 refills | Status: DC

## 2020-09-26 NOTE — Progress Notes (Signed)
Subjective: CC: uncontrolled DM PCP: [name], DO BZJ:IRCVELF [name] is a 40 y.o. female presenting to clinic today for:  1. Type 2 Diabetes with hypertension, hyperlipidemia:  Patient notes that blood sugars have typically ranged in the 80s to 90s in the morning but can be as high as low 200s throughout the day.  She has not missed any doses of her Metformin, glipizide or Ozempic.  She is compliant with her Dyazide and has been tolerating Crestor daily.  No reports of chest pain, shortness of breath.  Sometimes she does feel sluggish when her blood sugar goes into the 80s and 90s and therefore she will try and eat something to bring it back up.  Last eye exam: needs Last foot exam: UTD Last A1c:  Lab Results  Component Value Date   HGBA1C 11.3 (H) 06/28/2020   Nephropathy screen indicated?: needs Last flu, zoster and/or pneumovax:  Immunization History  Administered Date(s) Administered  . PFIZER(Purple Top)SARS-COV-2 Vaccination 03/25/2020, 04/15/2020    2.  Bunions Patient reports bilateral bunions with right greater than left.  The right causes her discomfort.  Recently she purchased a bunion support online and thus far it seems to be helping.  She admits that there is a family history of bunion formation.  3.  Sulfur burps She reports bloating and sulfur burps.  She is compliant with Prilosec and has had acid reflux for quite some time.  No known contact with H. pylori.  Has never been evaluated nor treated for such.  ROS: Per HPI  No Known Allergies Past Medical History:  Diagnosis Date  . Allergy   . Diabetes mellitus without complication (HCC)   . Hypertension   . Trichimoniasis 01/06/2018   Treated 6/24, POC ____    Current Outpatient Medications:  .  glipiZIDE (GLUCOTROL XL) 5 MG 24 hr tablet, Take 1 tablet by mouth once daily with breakfast, Disp: 90 tablet, Rfl: 3 .  metFORMIN (GLUCOPHAGE) 1000 MG tablet, Take 1 tablet (1,000 mg total) by mouth 2  (two) times daily with a meal., Disp: 180 tablet, Rfl: 3 .  omeprazole (PRILOSEC) 20 MG capsule, Take 1 capsule (20 mg total) by mouth daily. For GERD, Disp: 30 capsule, Rfl: 3 .  rosuvastatin (CRESTOR) 5 MG tablet, Take 1 tablet (5 mg total) by mouth daily., Disp: 90 tablet, Rfl: 3 .  Semaglutide,0.25 or 0.5MG /DOS, (OZEMPIC, 0.25 OR 0.5 MG/DOSE,) 2 MG/1.5ML SOPN, Inject 0.5 mg into the skin every 7 (seven) days. DO NOT fill until January (waiting on new ins), Disp: 1.5 mL, Rfl: 12 .  triamterene-hydrochlorothiazide (DYAZIDE) 37.5-25 MG capsule, Take 1 capsule by mouth once daily, Disp: 30 capsule, Rfl: 3 Social History   Socioeconomic History  . Marital status: Single    Spouse name: Not on file  . Number of children: Not on file  . Years of education: Not on file  . Highest education level: Not on file  Occupational History  . Not on file  Tobacco Use  . Smoking status: Never Smoker  . Smokeless tobacco: Never Used  Vaping Use  . Vaping Use: Never used  Substance and Sexual Activity  . Alcohol use: Yes    Comment: social   . Drug use: Never  . Sexual activity: Yes    Birth control/protection: None  Other Topics Concern  . Not on file  Social History Narrative  . Not on file   Social Determinants of Health   Financial Resource Strain: Not on file  Food Insecurity: Not on file  Transportation Needs: Not on file  Physical Activity: Not on file  Stress: Not on file  Social Connections: Not on file  Intimate Partner Violence: Not on file   Family History  Problem Relation Age of Onset  . Diabetes Father   . Pneumonia Paternal Grandfather   . Lung cancer Maternal Grandmother   . Pneumonia Maternal Grandfather     Objective: Office vital signs reviewed. BP 125/85   Pulse (!) 101   Temp (!) 97.5 F (36.4 C) (Temporal)   Ht 5\' 3"  (1.6 m)   Wt 201 lb (91.2 kg)   SpO2 98%   BMI 35.61 kg/m   Physical Examination:  General: Awake, alert, well nourished, No acute  distress Cardio: regular rate and rhythm, S1S2 heard, no murmurs appreciated Pulm: clear to auscultation bilaterally, no wheezes, rhonchi or rales; normal work of breathing on room air Extremities: warm, well perfused, No edema, cyanosis or clubbing; +2 pulses bilaterally MSK: Valgus deformity noted of bilateral great toes right greater than left.  Assessment/ Plan: 40 y.o. female   Uncontrolled type 2 diabetes mellitus with hyperglycemia (HCC) - Plan: Bayer DCA Hb A1c Waived, Basic Metabolic Panel, Semaglutide, 1 MG/DOSE, (OZEMPIC, 1 MG/DOSE,) 4 MG/3ML SOPN, CANCELED: Microalbumin / creatinine urine ratio  Hyperlipidemia associated with type 2 diabetes mellitus (HCC) - Plan: LDL Cholesterol, Direct  Hypertension associated with diabetes (HCC)  Bunion of right foot  [place] - Plan: H Pylori, IGM, IGG, IGA AB  Gastroesophageal reflux disease without esophagitis - Plan: H Pylori, IGM, IGG, IGA AB  Diabetes still uncontrolled with A1c of 8.0 but this is down from 11.3.  She is done a great job with current regimen but I am going to go ahead and advance her Ozempic to 1 mg.  She is aware of new doses and new prescription.  Continue all other medicines.  We discussed if she has any hypoglycemic episodes she is to discontinue the glipizide.  She was good understanding of plan and will follow up in 3 months.  Will obtain urine microalbumin during that visit  Check direct LDL.  She is compliant with Crestor 5 mg and tolerating  Blood pressure under excellent control.  Continue current regimen  Bunion seems to be responding to current bunion treatment but we discussed referral to podiatry if needed going forward.  She will contact me for this  We will attempt to add H. pylori testing to her labs.  We will treat if appropriate.  Continue PPI  No orders of the defined types were placed in this encounter.  No orders of the defined types were placed in this encounter.    24,  DO [facility] [phone]

## 2020-09-26 NOTE — Patient Instructions (Signed)
You are ALMOST THERE!  A1c down to 8.0.  I'm going to advance your ozempic to 1.0 sub-q each week.  New rx sent.  Should not change copay.  See me back in 3 months, sooner if needed.  If you start getting LOW sugars, stop Glipizide and call me.

## 2020-09-27 LAB — BASIC METABOLIC PANEL
BUN/Creatinine Ratio: 14 (ref 9–23)
BUN: 12 mg/dL (ref 6–20)
CO2: 22 mmol/L (ref 20–29)
Calcium: 9.6 mg/dL (ref 8.7–10.2)
Chloride: 102 mmol/L (ref 96–106)
Creatinine, Ser: 0.85 mg/dL (ref 0.57–1.00)
Glucose: 157 mg/dL — ABNORMAL HIGH (ref 65–99)
Potassium: 4.1 mmol/L (ref 3.5–5.2)
Sodium: 142 mmol/L (ref 134–144)
eGFR: 89 mL/min/{1.73_m2} (ref >59)

## 2020-09-27 LAB — LDL CHOLESTEROL, DIRECT: LDL Direct: 125 mg/dL — ABNORMAL HIGH (ref 0–99)

## 2020-09-28 LAB — H PYLORI, IGM, IGG, IGA AB
H pylori, IgM Abs: <9 units (ref 0.0–8.9)
H. pylori, IgA Abs: <9 units (ref 0.0–8.9)
H. pylori, IgG AbS: 0.22 Index Value (ref 0.00–0.79)

## 2020-09-28 LAB — SPECIMEN STATUS REPORT

## 2020-12-22 ENCOUNTER — Telehealth: Payer: Self-pay | Admitting: Family Medicine

## 2020-12-22 DIAGNOSIS — E1165 Type 2 diabetes mellitus with hyperglycemia: Secondary | ICD-10-CM

## 2020-12-22 NOTE — Telephone Encounter (Signed)
Pt called stating that she went to get a refill on her Ozempic and was told by [facility] pharmacy that it was going to cost her over $800 to fill Rx because her insurance only allows a certain amount of times that certain Rx's like Ozempic can be filled at a commercial pharmacy and that pts PCP needs to write a new Rx and send to [facility] mail order in order for insurance to pay.  Please advise and call patient when issue has been resolved.

## 2020-12-23 MED ORDER — OZEMPIC (1 MG/DOSE) 4 MG/3ML ~~LOC~~ SOPN: 1.0000 mg | PEN_INJECTOR | SUBCUTANEOUS | 99 refills | Status: DC

## 2020-12-23 NOTE — Telephone Encounter (Signed)
Patient aware that Ozempic has been sent to [facility]

## 2020-12-23 NOTE — Telephone Encounter (Signed)
Looks like this was taken care of by [facility] already

## 2020-12-28 ENCOUNTER — Encounter: Payer: Self-pay | Admitting: Family Medicine

## 2020-12-28 ENCOUNTER — Other Ambulatory Visit: Payer: Self-pay

## 2020-12-28 ENCOUNTER — Ambulatory Visit: Payer: Managed Care, Other (non HMO) | Admitting: Family Medicine

## 2020-12-28 VITALS — BP 126/92 | HR 89 | Temp 98.3°F | Ht 63.0 in | Wt 197.4 lb

## 2020-12-28 DIAGNOSIS — E785 Hyperlipidemia, unspecified: Secondary | ICD-10-CM | POA: Diagnosis not present

## 2020-12-28 DIAGNOSIS — E1169 Type 2 diabetes mellitus with other specified complication: Secondary | ICD-10-CM | POA: Diagnosis not present

## 2020-12-28 DIAGNOSIS — E1165 Type 2 diabetes mellitus with hyperglycemia: Secondary | ICD-10-CM | POA: Diagnosis not present

## 2020-12-28 DIAGNOSIS — E1159 Type 2 diabetes mellitus with other circulatory complications: Secondary | ICD-10-CM | POA: Diagnosis not present

## 2020-12-28 DIAGNOSIS — I152 Hypertension secondary to endocrine disorders: Secondary | ICD-10-CM

## 2020-12-28 LAB — BAYER DCA HB A1C WAIVED: HB A1C (BAYER DCA - WAIVED): 7.7 % — ABNORMAL HIGH (ref <7.0)

## 2020-12-28 MED ORDER — GLIPIZIDE ER 5 MG PO TB24
ORAL_TABLET | ORAL | 3 refills | Status: DC
Start: 2020-12-28 — End: 2021-04-03

## 2020-12-28 MED ORDER — OZEMPIC (1 MG/DOSE) 4 MG/3ML ~~LOC~~ SOPN: 1.0000 mg | PEN_INJECTOR | SUBCUTANEOUS | 99 refills | Status: DC

## 2020-12-28 MED ORDER — ROSUVASTATIN CALCIUM 5 MG PO TABS: 5.0000 mg | ORAL_TABLET | Freq: Every day | ORAL | 3 refills | Status: DC

## 2020-12-28 MED ORDER — TRIAMTERENE-HCTZ 37.5-25 MG PO CAPS: 1.0000 | ORAL_CAPSULE | Freq: Every day | ORAL | 3 refills | Status: DC

## 2020-12-28 MED ORDER — METFORMIN HCL 1000 MG PO TABS: 1000.0000 mg | ORAL_TABLET | Freq: Two times a day (BID) | ORAL | 3 refills | Status: DC

## 2020-12-28 NOTE — Progress Notes (Signed)
Subjective: CC:DM PCP: [name], DO ZYS:[id number] Bartram is a 40 y.o. female presenting to clinic today for:  1. Type 2 Diabetes with hypertension, hyperlipidemia:  She admits that she has been out of her Ozempic for the last 2 weeks now because there was an issue with insurance and this required mail order instead of local pharmacy.  He was over $800 if she were to get it from the local pharmacy.  She has been compliant with metformin, glipizide, Crestor and her Dyazide.  No reports of chest pain, shortness of breath.  She does admit to occasional nausea and vomiting.  This seems to be dependent on whether or not she is "rushing".  Last eye exam: Up-to-date Last foot exam: Up-to-date Last A1c:  Lab Results  Component Value Date   HGBA1C 8.0 (H) 09/26/2020   Nephropathy screen indicated?:  Needs Last flu, zoster and/or pneumovax:  Immunization History  Administered Date(s) Administered   PFIZER(Purple Top)SARS-COV-2 Vaccination 03/25/2020, 04/15/2020    ROS: Per HPI  No Known Allergies Past Medical History:  Diagnosis Date   Allergy    Diabetes mellitus without complication (HCC)    Hypertension    Trichimoniasis 01/06/2018   Treated 6/24, POC ____    Current Outpatient Medications:    glipiZIDE (GLUCOTROL XL) 5 MG 24 hr tablet, Take 1 tablet by mouth once daily with breakfast, Disp: 90 tablet, Rfl: 3   metFORMIN (GLUCOPHAGE) 1000 MG tablet, Take 1 tablet (1,000 mg total) by mouth 2 (two) times daily with a meal., Disp: 180 tablet, Rfl: 3   omeprazole (PRILOSEC) 20 MG capsule, Take 1 capsule (20 mg total) by mouth daily. For GERD, Disp: 30 capsule, Rfl: 3   rosuvastatin (CRESTOR) 5 MG tablet, Take 1 tablet (5 mg total) by mouth daily., Disp: 90 tablet, Rfl: 3   triamterene-hydrochlorothiazide (DYAZIDE) 37.5-25 MG capsule, Take 1 capsule by mouth once daily, Disp: 30 capsule, Rfl: 3   Semaglutide, 1 MG/DOSE, (OZEMPIC, 1 MG/DOSE,) 4 MG/3ML SOPN, Inject 1 mg into the  skin every 7 (seven) days. (Patient not taking: Reported on 12/28/2020), Disp: 3 mL, Rfl: prn Social History   Socioeconomic History   Marital status: Single    Spouse name: Not on file   Number of children: Not on file   Years of education: Not on file   Highest education level: Not on file  Occupational History   Not on file  Tobacco Use   Smoking status: Never   Smokeless tobacco: Never  Vaping Use   Vaping Use: Never used  Substance and Sexual Activity   Alcohol use: Yes    Comment: social    Drug use: Never   Sexual activity: Yes    Birth control/protection: None  Other Topics Concern   Not on file  Social History Narrative   Not on file   Social Determinants of Health   Financial Resource Strain: Not on file  Food Insecurity: Not on file  Transportation Needs: Not on file  Physical Activity: Not on file  Stress: Not on file  Social Connections: Not on file  Intimate Partner Violence: Not on file   Family History  Problem Relation Age of Onset   Diabetes Father    Pneumonia Paternal Grandfather    Lung cancer Maternal Grandmother    Pneumonia Maternal Grandfather     Objective: Office vital signs reviewed. BP (!) 126/92   Pulse 89   Temp 98.3 F (36.8 C)   Ht 5\' 3"  (1.6  m)   Wt 197 lb 6.4 oz (89.5 kg)   SpO2 99%   BMI 34.97 kg/m   Physical Examination:  General: Awake, alert, well nourished, No acute distress HEENT: Normal, sclera white, MMM Cardio: regular rate and rhythm, S1S2 heard, no murmurs appreciated Pulm: clear to auscultation bilaterally, no wheezes, rhonchi or rales; normal work of breathing on room air Extremities: warm, well perfused, No edema, cyanosis or clubbing; +2 pulses bilaterally  Assessment/ Plan: 40 y.o. female   Uncontrolled type 2 diabetes mellitus with hyperglycemia (HCC) - Plan: Bayer DCA Hb A1c Waived, Microalbumin / creatinine urine ratio, glipiZIDE (GLUCOTROL XL) 5 MG 24 hr tablet, metFORMIN (GLUCOPHAGE) 1000 MG  tablet, Semaglutide, 1 MG/DOSE, (OZEMPIC, 1 MG/DOSE,) 4 MG/3ML SOPN  Hyperlipidemia associated with type 2 diabetes mellitus (HCC) - Plan: rosuvastatin (CRESTOR) 5 MG tablet  Hypertension associated with diabetes (HCC) - Plan: triamterene-hydrochlorothiazide (DYAZIDE) 37.5-25 MG capsule  Her blood sugars clearly responding to the increased dose of Ozempic but unfortunate she is out of this for the last 2 weeks.  Unsure if the A1c that was collected today at 7.7 is truly reflective of the progress that she has made.  It certainly down from previous checkup which A1c was 8.0 at that time.  My plan is to advance her to 2 mg subcutaneously each week of Ozempic but I would like her to follow-up with Raynelle Fanning before advancing this since she has missed now 2 weeks of medication.  We discussed Rybelsus as possible alternative is well  Urine microalbumin has been collected  Continue statin.  New medication have been sent to her mail order pharmacy  Continue Dyazide.  Blood pressure is not at goal.  Orders Placed This Encounter  Procedures   Bayer DCA Hb A1c Waived   Microalbumin / creatinine urine ratio   Meds ordered this encounter  Medications   glipiZIDE (GLUCOTROL XL) 5 MG 24 hr tablet    Sig: Take 1 tablet by mouth once daily with breakfast    Dispense:  90 tablet    Refill:  3   metFORMIN (GLUCOPHAGE) 1000 MG tablet    Sig: Take 1 tablet (1,000 mg total) by mouth 2 (two) times daily with a meal.    Dispense:  180 tablet    Refill:  3   rosuvastatin (CRESTOR) 5 MG tablet    Sig: Take 1 tablet (5 mg total) by mouth daily.    Dispense:  90 tablet    Refill:  3   triamterene-hydrochlorothiazide (DYAZIDE) 37.5-25 MG capsule    Sig: Take 1 each (1 capsule total) by mouth daily.    Dispense:  90 capsule    Refill:  3   Semaglutide, 1 MG/DOSE, (OZEMPIC, 1 MG/DOSE,) 4 MG/3ML SOPN    Sig: Inject 1 mg into the skin every 7 (seven) days.    Dispense:  9 mL    Refill:  prn     [name], DO [facility] [phone]

## 2020-12-28 NOTE — Patient Instructions (Signed)
Sugar IS getting better.  Down from 8.0 to 7.7.  I'm inclined to increase your dose to the 2mg  of Ozempic.  Our pharmacist has samples of this.  I want you to arrange a visit with her ASAP.

## 2020-12-29 LAB — MICROALBUMIN / CREATININE URINE RATIO
Creatinine, Urine: 96 mg/dL
Microalb/Creat Ratio: 25 mg/g creat (ref 0–29)
Microalbumin, Urine: 24.4 ug/mL

## 2020-12-30 ENCOUNTER — Ambulatory Visit: Payer: Managed Care, Other (non HMO) | Admitting: Pharmacist

## 2021-04-03 ENCOUNTER — Ambulatory Visit: Payer: Managed Care, Other (non HMO) | Admitting: Family Medicine

## 2021-04-03 ENCOUNTER — Other Ambulatory Visit: Payer: Self-pay

## 2021-04-03 ENCOUNTER — Encounter: Payer: Self-pay | Admitting: Family Medicine

## 2021-04-03 VITALS — BP 124/88 | HR 90 | Temp 98.4°F | Ht 63.0 in | Wt 173.4 lb

## 2021-04-03 DIAGNOSIS — E1159 Type 2 diabetes mellitus with other circulatory complications: Secondary | ICD-10-CM | POA: Diagnosis not present

## 2021-04-03 DIAGNOSIS — E1169 Type 2 diabetes mellitus with other specified complication: Secondary | ICD-10-CM | POA: Diagnosis not present

## 2021-04-03 DIAGNOSIS — E785 Hyperlipidemia, unspecified: Secondary | ICD-10-CM | POA: Diagnosis not present

## 2021-04-03 DIAGNOSIS — I152 Hypertension secondary to endocrine disorders: Secondary | ICD-10-CM

## 2021-04-03 LAB — BAYER DCA HB A1C WAIVED: HB A1C (BAYER DCA - WAIVED): 6.4 % — ABNORMAL HIGH (ref 4.8–5.6)

## 2021-04-03 NOTE — Patient Instructions (Signed)
You did it!!!!!!! I'm so proud of you!  You've graduated to the 4 month follow up club!  Hopefully, if still looking good in 4 months, we'll go out to every 6 months.  I've gotten rid of the Glipizide.  You don't need that.

## 2021-04-03 NOTE — Progress Notes (Signed)
 Subjective: CC: DM. PCP: Gottschalk, Ashly M, DO HPI:Melissa Kelley is a 40 y.o. female presenting to clinic today for:  Type 2 Diabetes with hypertension, hyperlipidemia:  High at home: 250; Low at home: 80, Taking medication(s): Ozempic 1 mg subcutaneous each week, metformin 1000 mg twice daily, Dyazide, Crestor 5 mg.  Last eye exam: Up-to-date Last foot exam: Needs Last A1c:  Lab Results  Component Value Date   HGBA1C 7.7 (H) 12/28/2020   Nephropathy screen indicated?:  Up-to-date Last flu, zoster and/or pneumovax:  Immunization History  Administered Date(s) Administered   PFIZER(Purple Top)SARS-COV-2 Vaccination 03/25/2020, 04/15/2020    ROS: Denies dizziness, LOC, polyuria, polydipsia, unintended weight loss/gain, foot ulcerations, numbness or tingling in extremities, shortness of breath or chest pain.    ROS: Per HPI  No Known Allergies Past Medical History:  Diagnosis Date   Allergy    Diabetes mellitus without complication (HCC)    Hypertension    Trichimoniasis 01/06/2018   Treated 6/24, POC ____    Current Outpatient Medications:    glipiZIDE (GLUCOTROL XL) 5 MG 24 hr tablet, Take 1 tablet by mouth once daily with breakfast, Disp: 90 tablet, Rfl: 3   metFORMIN (GLUCOPHAGE) 1000 MG tablet, Take 1 tablet (1,000 mg total) by mouth 2 (two) times daily with a meal., Disp: 180 tablet, Rfl: 3   omeprazole (PRILOSEC) 20 MG capsule, Take 1 capsule (20 mg total) by mouth daily. For GERD, Disp: 30 capsule, Rfl: 3   rosuvastatin (CRESTOR) 5 MG tablet, Take 1 tablet (5 mg total) by mouth daily., Disp: 90 tablet, Rfl: 3   Semaglutide, 1 MG/DOSE, (OZEMPIC, 1 MG/DOSE,) 4 MG/3ML SOPN, Inject 1 mg into the skin every 7 (seven) days., Disp: 9 mL, Rfl: prn   triamterene-hydrochlorothiazide (DYAZIDE) 37.5-25 MG capsule, Take 1 each (1 capsule total) by mouth daily., Disp: 90 capsule, Rfl: 3 Social History   Socioeconomic History   Marital status: Single    Spouse name: Not on  file   Number of children: Not on file   Years of education: Not on file   Highest education level: Not on file  Occupational History   Not on file  Tobacco Use   Smoking status: Never   Smokeless tobacco: Never  Vaping Use   Vaping Use: Never used  Substance and Sexual Activity   Alcohol use: Yes    Comment: social    Drug use: Never   Sexual activity: Yes    Birth control/protection: None  Other Topics Concern   Not on file  Social History Narrative   Not on file   Social Determinants of Health   Financial Resource Strain: Not on file  Food Insecurity: Not on file  Transportation Needs: Not on file  Physical Activity: Not on file  Stress: Not on file  Social Connections: Not on file  Intimate Partner Violence: Not on file   Family History  Problem Relation Age of Onset   Diabetes Father    Pneumonia Paternal Grandfather    Lung cancer Maternal Grandmother    Pneumonia Maternal Grandfather     Objective: Office vital signs reviewed. Temp 98.4 F (36.9 C)   Ht 5' 3" (1.6 m)   Wt 173 lb 6.4 oz (78.7 kg)   BMI 30.72 kg/m   Physical Examination:  General: Awake, alert, well appearing female, No acute distress HEENT: Normal, sclera white, MMM Cardio: regular rate and rhythm, S1S2 heard, no murmurs appreciated Pulm: clear to auscultation bilaterally, no wheezes, rhonchi or   rales; normal work of breathing on room air   Assessment/ Plan: 40 y.o. female   Controlled type 2 diabetes mellitus with other specified complication, without long-term current use of insulin (HCC) - Plan: Bayer DCA Hb A1c Waived  Hypertension associated with diabetes (HCC) - Plan: CMP14+EGFR, Lipid panel  Hyperlipidemia associated with type 2 diabetes mellitus (HCC)  She has responded to the Ozempic really well and has really lost a good amount of weight as a result of the medication.  She has finally achieved controlled diabetes with A1c down to 6.4 today.  I congratulated her on this  I have discontinued the glipizide from her list that she does not need that medicine.  She may continue metformin 1000 mg twice daily and Ozempic 1 mg subcutaneously every 7 days.  We will follow-up in 4 months since she is well controlled, sooner if concerns arise  Blood pressure is controlled upon recheck.  No changes needed  Continue low-dose statin daily.  Not yet due for fasting lipid  Orders Placed This Encounter  Procedures   CMP14+EGFR   Bayer DCA Hb A1c Waived   Lipid panel   No orders of the defined types were placed in this encounter.    Ashly M Gottschalk, DO [facility] [phone]   

## 2021-04-04 LAB — LIPID PANEL
Chol/HDL Ratio: 4.3 ratio (ref 0.0–4.4)
Cholesterol, Total: 172 mg/dL (ref 100–199)
HDL: 40 mg/dL (ref >39)
LDL Chol Calc (NIH): 118 mg/dL — ABNORMAL HIGH (ref 0–99)
Triglycerides: 72 mg/dL (ref 0–149)
VLDL Cholesterol Cal: 14 mg/dL (ref 5–40)

## 2021-04-04 LAB — CMP14+EGFR
ALT: 14 IU/L (ref 0–32)
AST: 17 IU/L (ref 0–40)
Albumin/Globulin Ratio: 1.5 (ref 1.2–2.2)
Albumin: 4.2 g/dL (ref 3.8–4.8)
Alkaline Phosphatase: 69 IU/L (ref 44–121)
BUN/Creatinine Ratio: 10 (ref 9–23)
BUN: 8 mg/dL (ref 6–24)
Bilirubin Total: 0.3 mg/dL (ref 0.0–1.2)
CO2: 25 mmol/L (ref 20–29)
Calcium: 9.3 mg/dL (ref 8.7–10.2)
Chloride: 101 mmol/L (ref 96–106)
Creatinine, Ser: 0.81 mg/dL (ref 0.57–1.00)
Globulin, Total: 2.8 g/dL (ref 1.5–4.5)
Glucose: 113 mg/dL — ABNORMAL HIGH (ref 65–99)
Potassium: 3.3 mmol/L — ABNORMAL LOW (ref 3.5–5.2)
Sodium: 142 mmol/L (ref 134–144)
Total Protein: 7 g/dL (ref 6.0–8.5)
eGFR: 94 mL/min/{1.73_m2} (ref >59)

## 2021-04-04 MED ORDER — POTASSIUM CHLORIDE CRYS ER 20 MEQ PO TBCR: 20.0000 meq | EXTENDED_RELEASE_TABLET | Freq: Two times a day (BID) | ORAL | 0 refills | Status: DC

## 2021-04-04 NOTE — Progress Notes (Signed)
Pt returning call about labs. Please call back  

## 2021-04-04 NOTE — Addendum Note (Signed)
Addended by: Fara Olden on: 04/04/2021 09:52 AM   Modules accepted: Orders

## 2021-07-14 ENCOUNTER — Telehealth: Payer: Self-pay | Admitting: *Deleted

## 2021-07-14 NOTE — Telephone Encounter (Signed)
Received fax from [facility] for PA on Ozempic- filled out and placed on Dr Reece Agar desk to be signed and faxed back.

## 2021-07-21 ENCOUNTER — Telehealth: Payer: Self-pay | Admitting: Family Medicine

## 2021-07-21 NOTE — Telephone Encounter (Signed)
PA already in on Ozempic

## 2021-07-24 NOTE — Telephone Encounter (Signed)
Form faxed back on 1/6

## 2021-07-25 NOTE — Telephone Encounter (Signed)
PA approved 06/25/21-07/25/2022.   Patient aware.

## 2021-08-04 ENCOUNTER — Ambulatory Visit: Payer: Managed Care, Other (non HMO) | Admitting: Family Medicine

## 2021-09-11 ENCOUNTER — Ambulatory Visit: Payer: Managed Care, Other (non HMO) | Admitting: Family Medicine

## 2021-09-11 ENCOUNTER — Ambulatory Visit: Payer: Managed Care, Other (non HMO)

## 2021-09-11 ENCOUNTER — Encounter: Payer: Self-pay | Admitting: Family Medicine

## 2021-09-11 VITALS — BP 132/92 | HR 89 | Temp 97.4°F | Ht 63.0 in | Wt 191.8 lb

## 2021-09-11 DIAGNOSIS — E1159 Type 2 diabetes mellitus with other circulatory complications: Secondary | ICD-10-CM

## 2021-09-11 DIAGNOSIS — E1169 Type 2 diabetes mellitus with other specified complication: Secondary | ICD-10-CM | POA: Diagnosis not present

## 2021-09-11 DIAGNOSIS — M5412 Radiculopathy, cervical region: Secondary | ICD-10-CM

## 2021-09-11 DIAGNOSIS — M25511 Pain in right shoulder: Secondary | ICD-10-CM | POA: Diagnosis not present

## 2021-09-11 DIAGNOSIS — E785 Hyperlipidemia, unspecified: Secondary | ICD-10-CM

## 2021-09-11 DIAGNOSIS — I152 Hypertension secondary to endocrine disorders: Secondary | ICD-10-CM

## 2021-09-11 LAB — BAYER DCA HB A1C WAIVED: HB A1C (BAYER DCA - WAIVED): 6.9 % — ABNORMAL HIGH (ref 4.8–5.6)

## 2021-09-11 MED ORDER — METHYLPREDNISOLONE ACETATE 40 MG/ML IJ SUSP
40.0000 mg | Freq: Once | INTRAMUSCULAR | Status: AC
  Administered 2021-09-11: 40 mg via INTRAMUSCULAR

## 2021-09-11 MED ORDER — GABAPENTIN 300 MG PO CAPS: ORAL_CAPSULE | ORAL | 0 refills | Status: DC

## 2021-09-11 NOTE — Patient Instructions (Signed)
Foot exam next visit! I have ordered xrays, sent in gabapentin. We will plan for referral to orthopedics pending your xray results.

## 2021-09-11 NOTE — Progress Notes (Signed)
Subjective: CC:Dm PCP: [name], DO OIZ:[id number] Bixler is a 41 y.o. female presenting to clinic today for:  1. Type 2 Diabetes with hypertension, hyperlipidemia:  Patient is compliant with her medications.  Last eye exam: Up-to-date Last foot exam: Needs but wants to hold off on this till next visit Last A1c:  Lab Results  Component Value Date   HGBA1C 6.9 (H) 09/11/2021   Nephropathy screen indicated?:  Up-to-date Last flu, zoster and/or pneumovax:  Immunization History  Administered Date(s) Administered   PFIZER(Purple Top)SARS-COV-2 Vaccination 03/25/2020, 04/15/2020    ROS: Denies dizziness, LOC, polyuria, polydipsia, unintended weight loss/gain, foot ulcerations, numbness or tingling in extremities, shortness of breath or chest pain.  2.  Back pain Patient reports upper back and right shoulder.  Symptoms have been going on for over 3 months and are refractory to Flexeril and other OTC analgesics.  Intermittent burning/tingling of the right upper extremity present.  Occasional weakness of the right upper extremity as well    ROS: Per HPI  No Known Allergies Past Medical History:  Diagnosis Date   Allergy    Diabetes mellitus without complication (HCC)    Hypertension    Trichimoniasis 01/06/2018   Treated 6/24, POC ____    Current Outpatient Medications:    metFORMIN (GLUCOPHAGE) 1000 MG tablet, Take 1 tablet (1,000 mg total) by mouth 2 (two) times daily with a meal., Disp: 180 tablet, Rfl: 3   omeprazole (PRILOSEC) 20 MG capsule, Take 1 capsule (20 mg total) by mouth daily. For GERD, Disp: 30 capsule, Rfl: 3   potassium chloride SA (KLOR-CON) 20 MEQ tablet, Take 1 tablet (20 mEq total) by mouth 2 (two) times daily., Disp: 6 tablet, Rfl: 0   rosuvastatin (CRESTOR) 5 MG tablet, Take 1 tablet (5 mg total) by mouth daily., Disp: 90 tablet, Rfl: 3   Semaglutide, 1 MG/DOSE, (OZEMPIC, 1 MG/DOSE,) 4 MG/3ML SOPN, Inject 1 mg into the skin every 7 (seven) days.,  Disp: 9 mL, Rfl: prn   triamterene-hydrochlorothiazide (DYAZIDE) 37.5-25 MG capsule, Take 1 each (1 capsule total) by mouth daily., Disp: 90 capsule, Rfl: 3 Social History   Socioeconomic History   Marital status: Single    Spouse name: Not on file   Number of children: Not on file   Years of education: Not on file   Highest education level: Not on file  Occupational History   Not on file  Tobacco Use   Smoking status: Never   Smokeless tobacco: Never  Vaping Use   Vaping Use: Never used  Substance and Sexual Activity   Alcohol use: Yes    Comment: social    Drug use: Never   Sexual activity: Yes    Birth control/protection: None  Other Topics Concern   Not on file  Social History Narrative   Not on file   Social Determinants of Health   Financial Resource Strain: Not on file  Food Insecurity: Not on file  Transportation Needs: Not on file  Physical Activity: Not on file  Stress: Not on file  Social Connections: Not on file  Intimate Partner Violence: Not on file   Family History  Problem Relation Age of Onset   Diabetes Father    Pneumonia Paternal Grandfather    Lung cancer Maternal Grandmother    Pneumonia Maternal Grandfather     Objective: Office vital signs reviewed. BP (!) 132/92    Pulse 89    Temp (!) 97.4 F (36.3 C)    Ht  5\' 3"  (1.6 m)    Wt 191 lb 12.8 oz (87 kg)    SpO2 100%    BMI 33.98 kg/m   Physical Examination:  General: Awake, alert, obese, No acute distress HEENT: Sclera white.  Moist mucous membranes Cardio: regular rate and rhythm, S1S2 heard, no murmurs appreciated Pulm: clear to auscultation bilaterally, no wheezes, rhonchi or rales; normal work of breathing on room air MSK: No gross deformity appreciated within the neck or shoulder  Assessment/ Plan: 41 y.o. female   Controlled type 2 diabetes mellitus with other specified complication, without long-term current use of insulin (HCC) - Plan: Bayer DCA Hb A1c Waived  Hypertension  associated with diabetes (HCC)  Hyperlipidemia associated with type 2 diabetes mellitus (HCC)  Radiculopathy of cervical spine - Plan: DG Cervical Spine 2 or 3 views, gabapentin (NEURONTIN) 300 MG capsule  Right shoulder pain, unspecified chronicity - Plan: methylPREDNISolone acetate (DEPO-MEDROL) injection 40 mg  Sugar remains under good control with A1c of 6.9 today.  No changes to medications.  Blood pressure is not at goal however.  I do think that her blood pressure is likely reflective of the pain she is experiencing today.  Number, if persistently elevated upon her recheck with nurse in 2 weeks, we will consider adding Norvasc  Continue statin  Trial of gabapentin nightly for a week and then may increase to twice daily if needed.  Plain films ordered.  It sounds like her pain is likely reflective of a radiculopathy from the C-spine.  She is given a Depo-Medrol intramuscularly today since her sugar was well controlled and attempt to control her pain.  May consider referral pending these results  Orders Placed This Encounter  Procedures   Bayer DCA Hb A1c Waived   No orders of the defined types were placed in this encounter.    41, DO [facility] [phone]

## 2021-09-25 ENCOUNTER — Ambulatory Visit (INDEPENDENT_AMBULATORY_CARE_PROVIDER_SITE_OTHER): Payer: Managed Care, Other (non HMO) | Admitting: *Deleted

## 2021-09-25 VITALS — BP 132/83 | HR 85

## 2021-09-25 DIAGNOSIS — I152 Hypertension secondary to endocrine disorders: Secondary | ICD-10-CM

## 2021-09-25 DIAGNOSIS — E1159 Type 2 diabetes mellitus with other circulatory complications: Secondary | ICD-10-CM

## 2021-12-04 NOTE — Progress Notes (Unsigned)
Subjective: CC:DM PCP: Melissa Norlander, DO URK:[id number] Jakob is a 41 y.o. female presenting to clinic today for:  1. Type 2 Diabetes with hypertension, hyperlipidemia:  Patient reports compliance with her Ozempic 1 mg weekly, Crestor 5 mg daily and metformin 1000 mg twice daily.  Her blood sugars have been in the highest near 200s and she has had 1 low blood sugar at 68 this was after she was having some vomiting and decreased p.o. intake.  She has not had any recurrent low blood sugars.  Last eye exam: UTD Last foot exam: needs Last A1c:  Lab Results  Component Value Date   HGBA1C 6.9 (H) 09/11/2021   Nephropathy screen indicated?: needs Last flu, zoster and/or pneumovax:  Immunization History  Administered Date(s) Administered   PFIZER(Purple Top)SARS-COV-2 Vaccination 03/25/2020, 04/15/2020    ROS: No chest pain, shortness of breath, visual disturbance reported   ROS: Per HPI  No Known Allergies Past Medical History:  Diagnosis Date   Allergy    Diabetes mellitus without complication ([place])    Hypertension    Trichimoniasis 01/06/2018   Treated 6/24, POC ____    Current Outpatient Medications:    gabapentin (NEURONTIN) 300 MG capsule, Take 1 capsule (300 mg total) by mouth at bedtime for 7 days, THEN 1 capsule (300 mg total) 2 (two) times daily., Disp: 180 capsule, Rfl: 0   metFORMIN (GLUCOPHAGE) 1000 MG tablet, Take 1 tablet (1,000 mg total) by mouth 2 (two) times daily with a meal., Disp: 180 tablet, Rfl: 3   omeprazole (PRILOSEC) 20 MG capsule, Take 1 capsule (20 mg total) by mouth daily. For GERD, Disp: 30 capsule, Rfl: 3   potassium chloride SA (KLOR-CON) 20 MEQ tablet, Take 1 tablet (20 mEq total) by mouth 2 (two) times daily., Disp: 6 tablet, Rfl: 0   rosuvastatin (CRESTOR) 5 MG tablet, Take 1 tablet (5 mg total) by mouth daily., Disp: 90 tablet, Rfl: 3   Semaglutide, 1 MG/DOSE, (OZEMPIC, 1 MG/DOSE,) 4 MG/3ML SOPN, Inject 1 mg into the skin every 7 (seven)  days., Disp: 9 mL, Rfl: prn   triamterene-hydrochlorothiazide (DYAZIDE) 37.5-25 MG capsule, Take 1 each (1 capsule total) by mouth daily., Disp: 90 capsule, Rfl: 3 Social History   Socioeconomic History   Marital status: Single    Spouse name: Not on file   Number of children: Not on file   Years of education: Not on file   Highest education level: Not on file  Occupational History   Not on file  Tobacco Use   Smoking status: Never   Smokeless tobacco: Never  Vaping Use   Vaping Use: Never used  Substance and Sexual Activity   Alcohol use: Yes    Comment: social    Drug use: Never   Sexual activity: Yes    Birth control/protection: None  Other Topics Concern   Not on file  Social History Narrative   Not on file   Social Determinants of Health   Financial Resource Strain: Not on file  Food Insecurity: Not on file  Transportation Needs: Not on file  Physical Activity: Not on file  Stress: Not on file  Social Connections: Not on file  Intimate Partner Violence: Not on file   Family History  Problem Relation Age of Onset   Diabetes Father    Pneumonia Paternal Grandfather    Lung cancer Maternal Grandmother    Pneumonia Maternal Grandfather     Objective: Office vital signs reviewed. BP 123/88  Pulse 90   Temp (!) 97.2 F (36.2 C)   Ht '5\' 3"'  (1.6 m)   Wt 198 lb 6.4 oz (90 kg)   SpO2 98%   BMI 35.14 kg/m   Physical Examination:  General: Awake, alert, Morbidly obese, No acute distress HEENT: Normal, sclera white, MMM Cardio: regular rate and rhythm, S1S2 heard, no murmurs appreciated Pulm: clear to auscultation bilaterally, no wheezes, rhonchi or rales; normal work of breathing on room air Extremities: warm, well perfused, No edema, cyanosis or clubbing; +2 pulses bilaterally Neuro: see DM foot Diabetic Foot Exam - Simple   Simple Foot Form Diabetic Foot exam was performed with the following findings: Yes 12/05/2021  4:51 PM  Visual Inspection See  comments: Yes Sensation Testing Intact to touch and monofilament testing bilaterally: Yes Pulse Check Posterior Tibialis and Dorsalis pulse intact bilaterally: Yes Comments 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 female   Uncontrolled type 2 diabetes mellitus with hyperglycemia ([place]) - Plan: Bayer DCA Hb A1c Waived, Microalbumin / creatinine urine ratio, Semaglutide, 2 MG/DOSE, 8 MG/3ML SOPN  Hypertension associated with diabetes ([place]) - Plan: CMP14+EGFR  Hyperlipidemia associated with type 2 diabetes mellitus ([place]) - Plan: LDL Cholesterol, Direct, CMP14+EGFR   Sugar now uncontrolled A1c rising to 7.4.  We will advance her Ozempic to 35m weekly.  Continue metformin, Crestor. we discussed consideration for alternative such as Mounjaro but she is reluctant to make any more transitions with medications  Follow up in 3 m  No orders of the defined types were placed in this encounter.  No orders of the defined types were placed in this encounter.    AJanora Norlander DO [place]([phone]

## 2021-12-05 ENCOUNTER — Ambulatory Visit: Payer: Managed Care, Other (non HMO) | Admitting: Family Medicine

## 2021-12-05 ENCOUNTER — Encounter: Payer: Self-pay | Admitting: Family Medicine

## 2021-12-05 VITALS — BP 123/88 | HR 90 | Temp 97.2°F | Ht 63.0 in | Wt 198.4 lb

## 2021-12-05 DIAGNOSIS — E785 Hyperlipidemia, unspecified: Secondary | ICD-10-CM

## 2021-12-05 DIAGNOSIS — I152 Hypertension secondary to endocrine disorders: Secondary | ICD-10-CM

## 2021-12-05 DIAGNOSIS — E1159 Type 2 diabetes mellitus with other circulatory complications: Secondary | ICD-10-CM

## 2021-12-05 DIAGNOSIS — E1165 Type 2 diabetes mellitus with hyperglycemia: Secondary | ICD-10-CM | POA: Diagnosis not present

## 2021-12-05 DIAGNOSIS — E1169 Type 2 diabetes mellitus with other specified complication: Secondary | ICD-10-CM

## 2021-12-05 LAB — BAYER DCA HB A1C WAIVED: HB A1C (BAYER DCA - WAIVED): 7.4 % — ABNORMAL HIGH (ref 4.8–5.6)

## 2021-12-05 MED ORDER — SEMAGLUTIDE (2 MG/DOSE) 8 MG/3ML ~~LOC~~ SOPN: 2.0000 mg | PEN_INJECTOR | SUBCUTANEOUS | 3 refills | Status: DC

## 2021-12-06 LAB — CMP14+EGFR
ALT: 13 IU/L (ref 0–32)
AST: 15 IU/L (ref 0–40)
Albumin/Globulin Ratio: 1.3 (ref 1.2–2.2)
Albumin: 4.1 g/dL (ref 3.8–4.8)
Alkaline Phosphatase: 92 IU/L (ref 44–121)
BUN/Creatinine Ratio: 14 (ref 9–23)
BUN: 9 mg/dL (ref 6–24)
Bilirubin Total: <0.2 mg/dL (ref 0.0–1.2)
CO2: 23 mmol/L (ref 20–29)
Calcium: 9.2 mg/dL (ref 8.7–10.2)
Chloride: 99 mmol/L (ref 96–106)
Creatinine, Ser: 0.63 mg/dL (ref 0.57–1.00)
Globulin, Total: 3.1 g/dL (ref 1.5–4.5)
Glucose: 179 mg/dL — ABNORMAL HIGH (ref 70–99)
Potassium: 3.7 mmol/L (ref 3.5–5.2)
Sodium: 136 mmol/L (ref 134–144)
Total Protein: 7.2 g/dL (ref 6.0–8.5)
eGFR: 114 mL/min/{1.73_m2} (ref >59)

## 2021-12-06 LAB — LDL CHOLESTEROL, DIRECT: LDL Direct: 111 mg/dL — ABNORMAL HIGH (ref 0–99)

## 2022-02-08 ENCOUNTER — Other Ambulatory Visit: Payer: Self-pay | Admitting: Family Medicine

## 2022-02-08 DIAGNOSIS — E1159 Type 2 diabetes mellitus with other circulatory complications: Secondary | ICD-10-CM

## 2022-02-15 ENCOUNTER — Telehealth: Payer: Self-pay | Admitting: Family Medicine

## 2022-02-15 NOTE — Telephone Encounter (Signed)
Pt called to let PCP know that she was notified from her [facility] pharmacy that they cant fill pts Ozempic Rx because they are out of stock. Pt needs to know what her options are?  Please advise and call patient.

## 2022-02-16 NOTE — Telephone Encounter (Signed)
She can see if it is available at a local pharmacy OR We can switch to Rybelsus (oral formulation of Ozempic)

## 2022-02-16 NOTE — Telephone Encounter (Signed)
Pt aware- and will call pharmacies to see if they have in stock

## 2022-03-06 ENCOUNTER — Ambulatory Visit: Payer: Managed Care, Other (non HMO) | Admitting: Family Medicine

## 2022-04-02 ENCOUNTER — Other Ambulatory Visit: Payer: Self-pay

## 2022-04-02 DIAGNOSIS — E1165 Type 2 diabetes mellitus with hyperglycemia: Secondary | ICD-10-CM

## 2022-04-04 ENCOUNTER — Encounter: Payer: Self-pay | Admitting: Family Medicine

## 2022-04-04 ENCOUNTER — Ambulatory Visit: Payer: Managed Care, Other (non HMO) | Admitting: Family Medicine

## 2022-04-04 DIAGNOSIS — E1159 Type 2 diabetes mellitus with other circulatory complications: Secondary | ICD-10-CM | POA: Diagnosis not present

## 2022-04-04 DIAGNOSIS — I152 Hypertension secondary to endocrine disorders: Secondary | ICD-10-CM

## 2022-04-04 DIAGNOSIS — E785 Hyperlipidemia, unspecified: Secondary | ICD-10-CM | POA: Diagnosis not present

## 2022-04-04 DIAGNOSIS — E1169 Type 2 diabetes mellitus with other specified complication: Secondary | ICD-10-CM

## 2022-04-04 LAB — BAYER DCA HB A1C WAIVED: HB A1C (BAYER DCA - WAIVED): 7 % — ABNORMAL HIGH (ref 4.8–5.6)

## 2022-04-04 MED ORDER — TRIAMTERENE-HCTZ 37.5-25 MG PO CAPS: 1.0000 | ORAL_CAPSULE | Freq: Every day | ORAL | 3 refills | Status: DC

## 2022-04-04 MED ORDER — METFORMIN HCL 1000 MG PO TABS: 1000.0000 mg | ORAL_TABLET | Freq: Two times a day (BID) | ORAL | 3 refills | Status: DC

## 2022-04-04 MED ORDER — ROSUVASTATIN CALCIUM 5 MG PO TABS: 5.0000 mg | ORAL_TABLET | Freq: Every day | ORAL | 3 refills | Status: DC

## 2022-04-04 NOTE — Progress Notes (Signed)
Telephone visit  Subjective: CC:DM PCP: Melissa Norlander, DO NWG:NFAOZHY Gadd is a 41 y.o. female calls for telephone consult today. Patient provides verbal consent for consult held via phone.  Due to COVID-19 pandemic this visit was conducted virtually. This visit type was conducted due to national recommendations for restrictions regarding the COVID-19 Pandemic (e.g. social distancing, sheltering in place) in an effort to limit this patient's exposure and mitigate transmission in our community. All issues noted in this document were discussed and addressed.  A physical exam was not performed with this format.   Location of patient: home Location of provider: [facility] Others present for call: none  1. Type 2 Diabetes with hypertension, hyperlipidemia:  No low blood sugars.  She had some issues getting the Ozempic 2mg  but now she is on it and doing fine.  No nausea, vomiting, abdominal pain with increased dose.  Last eye exam: UTD Last foot exam: UTD Last A1c:  Lab Results  Component Value Date   HGBA1C 7.4 (H) 12/05/2021   Nephropathy screen indicated?: needs Last flu, zoster and/or pneumovax:  Immunization History  Administered Date(s) Administered   PFIZER(Purple Top)SARS-COV-2 Vaccination 03/25/2020, 04/15/2020    ROS: Per HPI  No Known Allergies Past Medical History:  Diagnosis Date   Allergy    Diabetes mellitus without complication ([place])    Hypertension    Trichimoniasis 01/06/2018   Treated 6/24, POC ____    Current Outpatient Medications:    metFORMIN (GLUCOPHAGE) 1000 MG tablet, Take 1 tablet (1,000 mg total) by mouth 2 (two) times daily with a meal., Disp: 180 tablet, Rfl: 3   omeprazole (PRILOSEC) 20 MG capsule, Take 1 capsule (20 mg total) by mouth daily. For GERD, Disp: 30 capsule, Rfl: 3   rosuvastatin (CRESTOR) 5 MG tablet, Take 1 tablet (5 mg total) by mouth daily., Disp: 90 tablet, Rfl: 3   Semaglutide, 2 MG/DOSE, 8 MG/3ML SOPN, Inject 2 mg as directed  once a week., Disp: 9 mL, Rfl: 3   triamterene-hydrochlorothiazide (DYAZIDE) 37.5-25 MG capsule, TAKE 1 CAPSULE DAILY, Disp: 90 capsule, Rfl: 0  Assessment/ Plan: 41 y.o. female   Controlled type 2 diabetes mellitus with other specified complication, without long-term current use of insulin ([place]) - Plan: CBC with Differential/Platelet, Bayer DCA Hb A1c Waived, Lipid panel, metFORMIN (GLUCOPHAGE) 1000 MG tablet  Hyperlipidemia associated with type 2 diabetes mellitus ([place]) - Plan: rosuvastatin (CRESTOR) 5 MG tablet  Hypertension associated with diabetes ([place]) - Plan: triamterene-hydrochlorothiazide (DYAZIDE) 37.5-25 MG capsule  Sugar is now controlled.  With A1c down to 7.0.  Continue current regimen.  Has refills on Ozempic 2 mg through May of next year.  Fasting lipid panel pending.  We will plan for urine microalbumin at next visit.  She will schedule III month follow-up  Continue statin  Continue blood pressure control  Start time: 12:08pm End time: 12:13p  Total time spent on patient care (including telephone call/ virtual visit): 5 minutes  [place], [employer] [phone]

## 2022-04-05 LAB — CBC WITH DIFFERENTIAL/PLATELET
Basophils Absolute: 0 10*3/uL (ref 0.0–0.2)
Basos: 0 %
EOS (ABSOLUTE): 0 10*3/uL (ref 0.0–0.4)
Eos: 1 %
Hematocrit: 37.6 % (ref 34.0–46.6)
Hemoglobin: 12.4 g/dL (ref 11.1–15.9)
Immature Grans (Abs): 0 10*3/uL (ref 0.0–0.1)
Immature Granulocytes: 0 %
Lymphocytes Absolute: 1.9 10*3/uL (ref 0.7–3.1)
Lymphs: 28 %
MCH: 30.2 pg (ref 26.6–33.0)
MCHC: 33 g/dL (ref 31.5–35.7)
MCV: 92 fL (ref 79–97)
Monocytes Absolute: 0.4 10*3/uL (ref 0.1–0.9)
Monocytes: 6 %
Neutrophils Absolute: 4.2 10*3/uL (ref 1.4–7.0)
Neutrophils: 65 %
Platelets: 320 10*3/uL (ref 150–450)
RBC: 4.11 x10E6/uL (ref 3.77–5.28)
RDW: 11.7 % (ref 11.7–15.4)
WBC: 6.6 10*3/uL (ref 3.4–10.8)

## 2022-04-05 LAB — LIPID PANEL
Chol/HDL Ratio: 3.7 ratio (ref 0.0–4.4)
Cholesterol, Total: 176 mg/dL (ref 100–199)
HDL: 47 mg/dL (ref >39)
LDL Chol Calc (NIH): 118 mg/dL — ABNORMAL HIGH (ref 0–99)
Triglycerides: 59 mg/dL (ref 0–149)
VLDL Cholesterol Cal: 11 mg/dL (ref 5–40)

## 2022-10-31 ENCOUNTER — Other Ambulatory Visit: Payer: Self-pay | Admitting: Family Medicine

## 2022-10-31 DIAGNOSIS — E1165 Type 2 diabetes mellitus with hyperglycemia: Secondary | ICD-10-CM

## 2022-10-31 MED ORDER — SEMAGLUTIDE (2 MG/DOSE) 8 MG/3ML ~~LOC~~ SOPN: 2.0000 mg | PEN_INJECTOR | SUBCUTANEOUS | 0 refills | Status: DC

## 2022-10-31 NOTE — Telephone Encounter (Signed)
Patient needs refill of Semaglutide Last office visit 04/04/22 Upcoming appointment 12/28/22 Last refill 12/05/21, 9 ml, 3 refills

## 2022-10-31 NOTE — Telephone Encounter (Signed)
  Prescription Request  10/31/2022  Is this a "Controlled Substance" medicine? NO  Have you seen your PCP in the last 2 weeks? NO  If YES, route message to pool  -  If NO, patient needs to be scheduled for appointment.  What is the name of the medication or equipment? Semaglutide 2 mg/dose, 8 MG/3ML  Have you contacted your pharmacy to request a refill? NO   Which pharmacy would you like this sent to? [facility]   Patient notified that their request is being sent to the clinical staff for review and that they should receive a response within 2 business days.

## 2022-11-02 ENCOUNTER — Other Ambulatory Visit: Payer: Self-pay

## 2022-11-02 DIAGNOSIS — E1165 Type 2 diabetes mellitus with hyperglycemia: Secondary | ICD-10-CM

## 2022-11-02 MED ORDER — SEMAGLUTIDE (2 MG/DOSE) 8 MG/3ML ~~LOC~~ SOPN
2.0000 mg | PEN_INJECTOR | SUBCUTANEOUS | 0 refills | Status: DC
Start: 2022-11-02 — End: 2022-11-12

## 2022-11-02 NOTE — Telephone Encounter (Signed)
Spoke with patient and medication needed to be re sent since her last name is no longer Fulp.  It has changed to [employer].  New rx sent and patient aware

## 2022-11-02 NOTE — Telephone Encounter (Signed)
Pt having issues getting [facility] to [facility] this Rx. Says she thinks the issue was that her last name changed. 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 Pt believes she was told that is needs PA. Pt is completely out of this medication and has been for a week.

## 2022-11-07 ENCOUNTER — Telehealth: Payer: Self-pay | Admitting: Family Medicine

## 2022-11-07 NOTE — Telephone Encounter (Signed)
Pt says that her PA is being filled out wrong and that is why she is not able to get her ozempic refill. It keeps getting denied. Pt has been out for 3 weeks now. Pt aware Nadine Counts is out till next week. Please call back

## 2022-11-12 MED ORDER — SEMAGLUTIDE (2 MG/DOSE) 8 MG/3ML ~~LOC~~ SOPN: 2.0000 mg | PEN_INJECTOR | SUBCUTANEOUS | 3 refills | Status: DC

## 2022-11-12 NOTE — Telephone Encounter (Signed)
Patient aware that PA has been approved until 11/09/2023.

## 2022-11-12 NOTE — Telephone Encounter (Signed)
Patient called to check on status of this request. Says she is not able to [facility] because her account says she doesn't have any refills on file.

## 2022-11-12 NOTE — Addendum Note (Signed)
Addended by: [name] on: 11/12/2022 11:56 AM   Modules accepted: Orders

## 2022-12-28 ENCOUNTER — Ambulatory Visit (INDEPENDENT_AMBULATORY_CARE_PROVIDER_SITE_OTHER): Payer: Managed Care, Other (non HMO) | Admitting: Family Medicine

## 2022-12-28 ENCOUNTER — Encounter: Payer: Self-pay | Admitting: Family Medicine

## 2022-12-28 VITALS — BP 131/90 | HR 91 | Temp 98.7°F | Ht 63.0 in | Wt 197.0 lb

## 2022-12-28 DIAGNOSIS — Z7985 Long-term (current) use of injectable non-insulin antidiabetic drugs: Secondary | ICD-10-CM | POA: Diagnosis not present

## 2022-12-28 DIAGNOSIS — H6993 Unspecified Eustachian tube disorder, bilateral: Secondary | ICD-10-CM

## 2022-12-28 DIAGNOSIS — E1169 Type 2 diabetes mellitus with other specified complication: Secondary | ICD-10-CM

## 2022-12-28 DIAGNOSIS — E785 Hyperlipidemia, unspecified: Secondary | ICD-10-CM

## 2022-12-28 DIAGNOSIS — E1165 Type 2 diabetes mellitus with hyperglycemia: Secondary | ICD-10-CM

## 2022-12-28 DIAGNOSIS — I152 Hypertension secondary to endocrine disorders: Secondary | ICD-10-CM

## 2022-12-28 DIAGNOSIS — E1159 Type 2 diabetes mellitus with other circulatory complications: Secondary | ICD-10-CM | POA: Diagnosis not present

## 2022-12-28 DIAGNOSIS — H6123 Impacted cerumen, bilateral: Secondary | ICD-10-CM

## 2022-12-28 LAB — BAYER DCA HB A1C WAIVED: HB A1C (BAYER DCA - WAIVED): 7.6 % — ABNORMAL HIGH (ref 4.8–5.6)

## 2022-12-28 MED ORDER — TRIAMTERENE-HCTZ 37.5-25 MG PO CAPS
1.0000 | ORAL_CAPSULE | Freq: Every day | ORAL | 3 refills | Status: DC
Start: 2022-12-28 — End: 2023-11-06

## 2022-12-28 MED ORDER — SEMAGLUTIDE (2 MG/DOSE) 8 MG/3ML ~~LOC~~ SOPN
2.0000 mg | PEN_INJECTOR | SUBCUTANEOUS | 3 refills | Status: DC
Start: 2022-12-28 — End: 2023-11-06

## 2022-12-28 MED ORDER — FLUTICASONE PROPIONATE 50 MCG/ACT NA SUSP
2.0000 | Freq: Every day | NASAL | 3 refills | Status: AC
Start: 2022-12-28 — End: ?

## 2022-12-28 MED ORDER — ROSUVASTATIN CALCIUM 5 MG PO TABS: 5.0000 mg | ORAL_TABLET | Freq: Every day | ORAL | 3 refills | Status: DC

## 2022-12-28 MED ORDER — METFORMIN HCL 1000 MG PO TABS: 1000.0000 mg | ORAL_TABLET | Freq: Two times a day (BID) | ORAL | 3 refills | Status: DC

## 2022-12-28 NOTE — Patient Instructions (Signed)
Eustachian Tube Dysfunction  Eustachian tube dysfunction refers to a condition in which a blockage develops in the narrow passage that connects the middle ear to the back of the nose (eustachian tube). The eustachian tube regulates air pressure in the middle ear by letting air move between the ear and nose. It also helps to drain fluid from the middle ear space. Eustachian tube dysfunction can affect one or both ears. When the eustachian tube does not function properly, air pressure, fluid, or both can build up in the middle ear. What are the causes? This condition occurs when the eustachian tube becomes blocked or cannot open normally. Common causes of this condition include: Ear infections. Colds and other infections that affect the nose, mouth, and throat (upper respiratory tract). Allergies. Irritation from cigarette smoke. Irritation from stomach acid coming up into the esophagus (gastroesophageal reflux). The esophagus is the part of the body that moves food from the mouth to the stomach. Sudden changes in air pressure, such as from descending in an airplane or scuba diving. Abnormal growths in the nose or throat, such as: Growths that line the nose (nasal polyps). Abnormal growth of cells (tumors). Enlarged tissue at the back of the throat (adenoids). What increases the risk? You are more likely to develop this condition if: You smoke. You are overweight. You are a child who has: Certain birth defects of the mouth, such as cleft palate. Large tonsils or adenoids. What are the signs or symptoms? Common symptoms of this condition include: A feeling of fullness in the ear. Ear pain. Clicking or popping noises in the ear. Ringing in the ear (tinnitus). Hearing loss. Loss of balance. Dizziness. Symptoms may get worse when the air pressure around you changes, such as when you travel to an area of high elevation, fly on an airplane, or go scuba diving. How is this diagnosed? This  condition may be diagnosed based on: Your symptoms. A physical exam of your ears, nose, and throat. Tests, such as those that measure: The movement of your eardrum. Your hearing (audiometry). How is this treated? Treatment depends on the cause and severity of your condition. In mild cases, you may relieve your symptoms by moving air into your ears. This is called "popping the ears." In more severe cases, or if you have symptoms of fluid in your ears, treatment may include: Medicines to relieve congestion (decongestants). Medicines that treat allergies (antihistamines). Nasal sprays or ear drops that contain medicines that reduce swelling (steroids). A procedure to drain the fluid in your eardrum. In this procedure, a small tube may be placed in the eardrum to: Drain the fluid. Restore the air in the middle ear space. A procedure to insert a balloon device through the nose to inflate the opening of the eustachian tube (balloon dilation). Follow these instructions at home: Lifestyle Do not do any of the following until your health care provider approves: Travel to high altitudes. Fly in airplanes. Work in a pressurized cabin or room. Scuba dive. Do not use any products that contain nicotine or tobacco. These products include cigarettes, chewing tobacco, and vaping devices, such as e-cigarettes. If you need help quitting, ask your health care provider. Keep your ears dry. Wear fitted earplugs during showering and bathing. Dry your ears completely after. General instructions Take over-the-counter and prescription medicines only as told by your health care provider. Use techniques to help pop your ears as recommended by your health care provider. These may include: Chewing gum. Yawning. Frequent, forceful swallowing.   Closing your mouth, holding your nose closed, and gently blowing as if you are trying to blow air out of your nose. Keep all follow-up visits. This is important. Contact a  health care provider if: Your symptoms do not go away after treatment. Your symptoms come back after treatment. You are unable to pop your ears. You have: A fever. Pain in your ear. Pain in your head or neck. Fluid draining from your ear. Your hearing suddenly changes. You become very dizzy. You lose your balance. Get help right away if: You have a sudden, severe increase in any of your symptoms. Summary Eustachian tube dysfunction refers to a condition in which a blockage develops in the eustachian tube. It can be caused by ear infections, allergies, inhaled irritants, or abnormal growths in the nose or throat. Symptoms may include ear pain or fullness, hearing loss, or ringing in the ears. Mild cases are treated with techniques to unblock the ears, such as yawning or chewing gum. More severe cases are treated with medicines or procedures. This information is not intended to replace advice given to you by your health care provider. Make sure you discuss any questions you have with your health care provider. Document Revised: 09/12/2020 Document Reviewed: 09/12/2020 Elsevier Patient Education  2024 Elsevier Inc.  

## 2022-12-28 NOTE — Progress Notes (Unsigned)
Subjective: CC:DM PCP: [name], DO XBJ:[id number] Broschart is a 42 y.o. female presenting to clinic today for:  1. Type 2 Diabetes with hypertension, hyperlipidemia:  Patient reports compliance with her medications.  She denies any hypoglycemic episodes but admits that she does not check blood sugar frequently.  Last eye exam: needs Last foot exam: needs Last A1c:  Lab Results  Component Value Date   HGBA1C 7.6 (H) 12/28/2022   Nephropathy screen indicated?: needs Last flu, zoster and/or pneumovax:  Immunization History  Administered Date(s) Administered   PFIZER(Purple Top)SARS-COV-2 Vaccination 03/25/2020, 04/15/2020    ROS: Denies dizziness, LOC, polyuria, polydipsia, unintended weight loss/gain, foot ulcerations, numbness or tingling in extremities, shortness of breath or chest pain.  2.  Bilateral ear pain Patient reports pain in her ears.  They seem to throb at nighttime.  She feels like she has drainage.  She denies any fevers.  This is been ongoing for quite some time now.  She was treated with oral antibiotics for suspected acute upper respiratory infection back in December.  ROS: Per HPI  No Known Allergies Past Medical History:  Diagnosis Date   Allergy    Diabetes mellitus without complication ([facility])    Hypertension    Trichimoniasis 01/06/2018   Treated 6/24, POC ____    Current Outpatient Medications:    metFORMIN (GLUCOPHAGE) 1000 MG tablet, Take 1 tablet (1,000 mg total) by mouth 2 (two) times daily with a meal., Disp: 180 tablet, Rfl: 3   rosuvastatin (CRESTOR) 5 MG tablet, Take 1 tablet (5 mg total) by mouth daily., Disp: 90 tablet, Rfl: 3   Semaglutide, 2 MG/DOSE, 8 MG/3ML SOPN, Inject 2 mg as directed once a week., Disp: 9 mL, Rfl: 3   triamterene-hydrochlorothiazide (DYAZIDE) 37.5-25 MG capsule, Take 1 each (1 capsule total) by mouth daily., Disp: 90 capsule, Rfl: 3 Social History   Socioeconomic History   Marital status: Single    Spouse  name: Not on file   Number of children: Not on file   Years of education: Not on file   Highest education level: Not on file  Occupational History   Not on file  Tobacco Use   Smoking status: Never   Smokeless tobacco: Never  Vaping Use   Vaping Use: Never used  Substance and Sexual Activity   Alcohol use: Yes    Comment: social    Drug use: Never   Sexual activity: Yes    Birth control/protection: None  Other Topics Concern   Not on file  Social History Narrative   Not on file   Social Determinants of Health   Financial Resource Strain: Not on file  Food Insecurity: Not on file  Transportation Needs: Not on file  Physical Activity: Not on file  Stress: Not on file  Social Connections: Not on file  Intimate Partner Violence: Not on file   Family History  Problem Relation Age of Onset   Diabetes Father    Pneumonia Paternal Grandfather    Lung cancer Maternal Grandmother    Pneumonia Maternal Grandfather     Objective: Office vital signs reviewed. BP (!) 131/90   Pulse 91   Temp 98.7 F (37.1 C)   Ht 5\' 3"  (1.6 m)   Wt 197 lb (89.4 kg)   LMP 12/25/2022   SpO2 99%   BMI 34.90 kg/m   Physical Examination:  General: Awake, alert, well nourished, No acute distress HEENT: sclera white, MMM.  Bilateral TMs obscured by impacted cerumen  Cardio: regular rate and rhythm, S1S2 heard, no murmurs appreciated Pulm: clear to auscultation bilaterally, no wheezes, rhonchi or rales; normal work of breathing on room air Neuro: see DM  Diabetic Foot Exam - Simple   Simple Foot Form Diabetic Foot exam was performed with the following findings: Yes 12/28/2022  4:48 PM  Visual Inspection See comments: Yes Sensation Testing Intact to touch and monofilament testing bilaterally: Yes Pulse Check Posterior Tibialis and Dorsalis pulse intact bilaterally: Yes Comments Mild hallucis valgus appreciated bilaterally      Assessment/ Plan: 42 y.o. female   Uncontrolled type 2  diabetes mellitus with hyperglycemia ([facility]) - Plan: Bayer DCA Hb A1c Waived, Microalbumin / creatinine urine ratio, CMP14+EGFR, metFORMIN (GLUCOPHAGE) 1000 MG tablet, Semaglutide, 2 MG/DOSE, 8 MG/3ML SOPN  Long-term (current) use of injectable non-insulin antidiabetic drugs  Hyperlipidemia associated with type 2 diabetes mellitus ([facility]) - Plan: rosuvastatin (CRESTOR) 5 MG tablet  Hypertension associated with diabetes ([facility]) - Plan: triamterene-hydrochlorothiazide (DYAZIDE) 37.5-25 MG capsule  Eustachian tube dysfunction, bilateral - Plan: fluticasone (FLONASE) 50 MCG/ACT nasal spray  Bilateral impacted cerumen  Sugar not controlled with A1c at 7.6 today.  Add glipizide extended release 5 mg daily.  Continue metformin 1000 mg twice daily semaglutide 2 mg subcutaneously q. 7 days.  Urine microalbumin collected.  Will plan for full fasting labs with annual physical exam.  Will try and assist with setting her up for diabetic eye exam here in office  Continue statin, blood pressure regimen.  BP is borderline  Suspect eustachian tube dysfunction but she had bilateral cerumen impaction which may also be contributing.  Unfortunately this could not be resolved with irrigation today due to intolerance of treatment.  We discussed use of Debrox to soften earwax we can try to reirrigate at some point.  Otherwise, plan for referral to ENT for manual disimpaction.    No orders of the defined types were placed in this encounter.  No orders of the defined types were placed in this encounter.    [name], DO [facility] [phone]

## 2022-12-29 ENCOUNTER — Encounter: Payer: Self-pay | Admitting: Family Medicine

## 2022-12-29 LAB — CMP14+EGFR
ALT: 10 IU/L (ref 0–32)
AST: 14 IU/L (ref 0–40)
Albumin: 4 g/dL (ref 3.9–4.9)
Alkaline Phosphatase: 99 IU/L (ref 44–121)
BUN/Creatinine Ratio: 19 (ref 9–23)
BUN: 13 mg/dL (ref 6–24)
Bilirubin Total: 0.2 mg/dL (ref 0.0–1.2)
CO2: 25 mmol/L (ref 20–29)
Calcium: 8.9 mg/dL (ref 8.7–10.2)
Chloride: 103 mmol/L (ref 96–106)
Creatinine, Ser: 0.7 mg/dL (ref 0.57–1.00)
Globulin, Total: 2.9 g/dL (ref 1.5–4.5)
Glucose: 126 mg/dL — ABNORMAL HIGH (ref 70–99)
Potassium: 3.7 mmol/L (ref 3.5–5.2)
Sodium: 143 mmol/L (ref 134–144)
Total Protein: 6.9 g/dL (ref 6.0–8.5)
eGFR: 111 mL/min/{1.73_m2} (ref >59)

## 2022-12-29 LAB — MICROALBUMIN / CREATININE URINE RATIO
Creatinine, Urine: 282.4 mg/dL
Microalb/Creat Ratio: 47 mg/g creat — ABNORMAL HIGH (ref 0–29)
Microalbumin, Urine: 132.2 ug/mL

## 2022-12-29 MED ORDER — GLIPIZIDE ER 5 MG PO TB24: 5.0000 mg | ORAL_TABLET | Freq: Every day | ORAL | 3 refills | Status: DC

## 2022-12-31 ENCOUNTER — Other Ambulatory Visit: Payer: Self-pay | Admitting: Family Medicine

## 2022-12-31 DIAGNOSIS — Z1231 Encounter for screening mammogram for malignant neoplasm of breast: Secondary | ICD-10-CM

## 2023-01-03 ENCOUNTER — Ambulatory Visit: Payer: Managed Care, Other (non HMO)

## 2023-01-09 ENCOUNTER — Ambulatory Visit
Admission: RE | Admit: 2023-01-09 | Discharge: 2023-01-09 | Disposition: A | Discharge status: Home / Self Care | Payer: Managed Care, Other (non HMO) | Source: Ambulatory Visit | Attending: Family Medicine | Admitting: Family Medicine

## 2023-01-09 DIAGNOSIS — Z1231 Encounter for screening mammogram for malignant neoplasm of breast: Secondary | ICD-10-CM

## 2023-02-02 ENCOUNTER — Other Ambulatory Visit: Payer: Self-pay | Admitting: Family Medicine

## 2023-02-02 DIAGNOSIS — E1165 Type 2 diabetes mellitus with hyperglycemia: Secondary | ICD-10-CM

## 2023-02-05 ENCOUNTER — Telehealth: Payer: Self-pay | Admitting: Family Medicine

## 2023-02-05 NOTE — Telephone Encounter (Signed)
This was sent in June already.  Did they not receive??

## 2023-02-05 NOTE — Telephone Encounter (Signed)
  Prescription Request  02/05/2023  Is this a "Controlled Substance" medicine? no  Have you seen your PCP in the last 2 weeks? Last appt on 12/28/2022  If YES, route message to pool  -  If NO, patient needs to be scheduled for appointment.  What is the name of the medication or equipment? OZEMPIC, 2 MG/DOSE, 8 MG/3ML SOPN [Pharmacy Med Name: OZEMPIC PEN (2MG /DOSE) 8MG /3ML]   Have you contacted your pharmacy to request a refill[facility]    Which [facility] would you like [facility] sent [facility]   Patient notified that their request is being sent to the clinical staff for review and that they should receive a response within 2 business days.

## 2023-02-06 ENCOUNTER — Other Ambulatory Visit: Payer: Self-pay | Admitting: Family Medicine

## 2023-02-06 DIAGNOSIS — E1165 Type 2 diabetes mellitus with hyperglycemia: Secondary | ICD-10-CM

## 2023-02-08 NOTE — Telephone Encounter (Signed)
Pt calling again about. Pt says that she did not get ozempic refill in mail order for the month of June for other rx. Pt has been out since last week. Please call back

## 2023-02-08 NOTE — Telephone Encounter (Signed)
Called and spoke with Pharmacy they have they stated patient had a billing issues on the account and as soon as she called them and fixed it they would mail it out. I called patient and let her know she states she will call and get it handled. FYI

## 2023-06-03 ENCOUNTER — Ambulatory Visit: Payer: Managed Care, Other (non HMO)

## 2023-06-03 DIAGNOSIS — E785 Hyperlipidemia, unspecified: Secondary | ICD-10-CM

## 2023-06-03 DIAGNOSIS — E1169 Type 2 diabetes mellitus with other specified complication: Secondary | ICD-10-CM

## 2023-06-03 LAB — HM DIABETES EYE EXAM

## 2023-06-03 NOTE — Progress Notes (Unsigned)
Melissa Kelley arrived 06/03/2023 and has given verbal consent to obtain images and complete their overdue diabetic retinal screening.  The images have been sent to an ophthalmologist or optometrist for review and interpretation.  Results will be sent back to [name], DO for review.  Patient has been informed they will be contacted when we receive the results via telephone or MyChart

## 2023-06-04 ENCOUNTER — Ambulatory Visit: Payer: Managed Care, Other (non HMO)

## 2023-07-30 ENCOUNTER — Encounter: Payer: Self-pay | Admitting: Family Medicine

## 2023-07-30 ENCOUNTER — Ambulatory Visit (INDEPENDENT_AMBULATORY_CARE_PROVIDER_SITE_OTHER): Payer: Managed Care, Other (non HMO)

## 2023-07-30 ENCOUNTER — Ambulatory Visit: Payer: Managed Care, Other (non HMO) | Admitting: Family Medicine

## 2023-07-30 VITALS — BP 130/76 | HR 86 | Temp 98.5°F | Ht 63.0 in | Wt 194.0 lb

## 2023-07-30 DIAGNOSIS — Z0001 Encounter for general adult medical examination with abnormal findings: Secondary | ICD-10-CM

## 2023-07-30 DIAGNOSIS — Z7985 Long-term (current) use of injectable non-insulin antidiabetic drugs: Secondary | ICD-10-CM | POA: Diagnosis not present

## 2023-07-30 DIAGNOSIS — G479 Sleep disorder, unspecified: Secondary | ICD-10-CM

## 2023-07-30 DIAGNOSIS — G8929 Other chronic pain: Secondary | ICD-10-CM

## 2023-07-30 DIAGNOSIS — E1169 Type 2 diabetes mellitus with other specified complication: Secondary | ICD-10-CM

## 2023-07-30 DIAGNOSIS — M25511 Pain in right shoulder: Secondary | ICD-10-CM | POA: Diagnosis not present

## 2023-07-30 DIAGNOSIS — E1159 Type 2 diabetes mellitus with other circulatory complications: Secondary | ICD-10-CM | POA: Diagnosis not present

## 2023-07-30 DIAGNOSIS — E785 Hyperlipidemia, unspecified: Secondary | ICD-10-CM

## 2023-07-30 DIAGNOSIS — I152 Hypertension secondary to endocrine disorders: Secondary | ICD-10-CM

## 2023-07-30 DIAGNOSIS — Z0000 Encounter for general adult medical examination without abnormal findings: Secondary | ICD-10-CM

## 2023-07-30 LAB — BAYER DCA HB A1C WAIVED: HB A1C (BAYER DCA - WAIVED): 7.6 % — ABNORMAL HIGH (ref 4.8–5.6)

## 2023-07-30 MED ORDER — TRAZODONE HCL 50 MG PO TABS: 25.0000 mg | ORAL_TABLET | Freq: Every evening | ORAL | 1 refills | Status: AC | PRN

## 2023-07-30 MED ORDER — GLIPIZIDE ER 10 MG PO TB24: 10.0000 mg | ORAL_TABLET | Freq: Every day | ORAL | 3 refills | Status: AC

## 2023-07-30 NOTE — Progress Notes (Signed)
 Melissa Kelley is a 43 y.o. female presents to office today for annual physical exam examination.    Concerns today include: 1. Type 2 Diabetes with hypertension, hyperlipidemia:  She reports compliance with her Ozempic , metformin  and glipizide .  She has been compliant with rosuvastatin  and blood pressure medicine as well.  She is not exercising regularly.  She reports no hypoglycemic episodes.  No missed doses of meds  Last eye exam: Up-to-date Last foot exam: Up-to-date Last A1c:  Lab Results  Component Value Date   HGBA1C 7.6 (H) 12/28/2022   Nephropathy screen indicated?:  Up-to-date Last flu, zoster and/or pneumovax:  Immunization History  Administered Date(s) Administered   PFIZER(Purple Top)SARS-COV-2 Vaccination 03/25/2020, 04/15/2020    ROS: No chest pain, shortness of breath or dizziness.  2.  Sleep disturbance Patient reports over the last several weeks she has been having increasing difficulty with sleep.  She is able to fall asleep without difficulty but often wakes up in the middle the night and cannot go back to sleep.  She denies any changes in sleep hygiene.  No new pets.  No new mattresses.  Does not report any change in stressors.  Denies overt anxiety but of course she has some mood lability because she is not sleeping well.  She is not sure if she snores or pauses breathing.  Her husband works a different shift than she does so he is not often present to observe.  3.  Shoulder pain Patient reports worsening right sided shoulder and arm pain.  Sometimes the pain goes down the right arm.  This has been present for the last couple of years but really got worse over the last couple of months.  She tried gabapentin  but did not feel that that was especially helpful.  She tried a muscle relaxer and felt that that was too sedating.  She is ready to go ahead with plain films and referral today  Occupation: nurse, Marital status: married, Substance use: none Health  Maintenance Due  Topic Date Due   Cervical Cancer Screening (HPV/Pap Cotest)  01/02/2023   HEMOGLOBIN A1C  06/29/2023   Refills needed today: all  Immunization History  Administered Date(s) Administered   PFIZER(Purple Top)SARS-COV-2 Vaccination 03/25/2020, 04/15/2020   Past Medical History:  Diagnosis Date   Allergy    Diabetes mellitus without complication (HCC)    Hypertension    Trichimoniasis 01/06/2018   Treated 6/24, POC ____   Social History   Socioeconomic History   Marital status: Single    Spouse name: Not on file   Number of children: Not on file   Years of education: Not on file   Highest education level: Not on file  Occupational History   Not on file  Tobacco Use   Smoking status: Never   Smokeless tobacco: Never  Vaping Use   Vaping status: Never Used  Substance and Sexual Activity   Alcohol use: Yes    Comment: social    Drug use: Never   Sexual activity: Yes    Birth control/protection: None  Other Topics Concern   Not on file  Social History Narrative   Not on file   Social Drivers of Health   Financial Resource Strain: Low Risk  (07/30/2023)   Overall Financial Resource Strain (CARDIA)    Difficulty of Paying Living Expenses: Not very hard  Food Insecurity: No Food Insecurity (07/30/2023)   Hunger Vital Sign    Worried About Running Out of Food in the Last Year:  Never true    Ran Out of Food in the Last Year: Never true  Transportation Needs: No Transportation Needs (07/30/2023)   PRAPARE - Administrator, Civil Service (Medical): No    Lack of Transportation (Non-Medical): No  Physical Activity: Inactive (07/30/2023)   Exercise Vital Sign    Days of Exercise per Week: 0 days    Minutes of Exercise per Session: 30 min  Stress: No Stress Concern Present (07/30/2023)   [employer] of Occupational Health - Occupational Stress Questionnaire    Feeling of Stress : Not at all  Social Connections: Moderately Integrated  (07/30/2023)   Social Connection and Isolation Panel [NHANES]    Frequency of Communication with Friends and Family: More than three times a week    Frequency of Social Gatherings with Friends and Family: More than three times a week    Attends Religious Services: 1 to 4 times per year    Active Member of [employer] or Organizations: No    Attends Banker Meetings: Never    Marital Status: Married  Catering Manager Violence: Not At Risk (07/30/2023)   Humiliation, Afraid, Rape, and Kick questionnaire    Fear of Current or Ex-Partner: No    Emotionally Abused: No    Physically Abused: No    Sexually Abused: No   History reviewed. No pertinent surgical history. Family History  Problem Relation Age of Onset   Diabetes Father    Lung cancer Maternal Grandmother    Pneumonia Maternal Grandfather    Pneumonia Paternal Grandfather    Breast cancer Neg Hx     Current Outpatient Medications:    fluticasone  (FLONASE ) 50 MCG/ACT nasal spray, Place 2 sprays into both nostrils daily., Disp: 48 g, Rfl: 3   glipiZIDE  (GLUCOTROL  XL) 5 MG 24 hr tablet, Take 1 tablet (5 mg total) by mouth daily with breakfast., Disp: 90 tablet, Rfl: 3   metFORMIN  (GLUCOPHAGE ) 1000 MG tablet, Take 1 tablet (1,000 mg total) by mouth 2 (two) times daily with a meal., Disp: 180 tablet, Rfl: 3   rosuvastatin  (CRESTOR ) 5 MG tablet, Take 1 tablet (5 mg total) by mouth daily., Disp: 90 tablet, Rfl: 3   Semaglutide , 2 MG/DOSE, 8 MG/3ML SOPN, Inject 2 mg as directed once a week., Disp: 9 mL, Rfl: 3   triamterene -hydrochlorothiazide (DYAZIDE) 37.5-25 MG capsule, Take 1 each (1 capsule total) by mouth daily., Disp: 90 capsule, Rfl: 3  No Known Allergies   ROS: Review of Systems Pertinent items noted in HPI and remainder of comprehensive ROS otherwise negative.    Physical exam BP 130/76   Pulse 86   Temp 98.5 F (36.9 C)   Ht 5' 3 (1.6 m)   Wt 194 lb (88 kg)   LMP 07/19/2023   SpO2 98%   BMI 34.37 kg/m   General appearance: alert, cooperative, appears stated age, no distress, and moderately obese Head: Normocephalic, without obvious abnormality, atraumatic Eyes: negative findings: lids and lashes normal, conjunctivae and sclerae normal, corneas clear, and pupils equal, round, reactive to light and accomodation Ears: normal TM's and external ear canals both ears Nose: Nares normal. Septum midline. Mucosa normal. No drainage or sinus tenderness. Throat: lips, mucosa, and tongue normal; teeth and gums normal Neck: no adenopathy. supple, symmetrical, trachea midline, and thyroid not enlarged, symmetric, no tenderness/mass/nodules Back: symmetric, no curvature. ROM normal. No CVA tenderness. Lungs: clear to auscultation bilaterally Heart: regular rate and rhythm, S1, S2 normal, no murmur, click, rub or  gallop Abdomen: soft, non-tender; bowel sounds normal; no masses,  no organomegaly Extremities: extremities normal, atraumatic, no cyanosis or edema Pulses: 2+ and symmetric Skin: Skin color, texture, turgor normal. No rashes or lesions Lymph nodes: Cervical, supraclavicular, and axillary nodes normal. Neurologic: Grossly normal      07/30/2023   10:34 AM 12/28/2022    3:35 PM 12/05/2021    3:48 PM  Depression screen PHQ 2/9  Decreased Interest 1 0   Down, Depressed, Hopeless 1 0   PHQ - 2 Score 2 0   Altered sleeping 3 1 2   Tired, decreased energy 1 0 1  Change in appetite 0 1 0  Feeling bad or failure about yourself  0 0 0  Trouble concentrating 2 0 0  Moving slowly or fidgety/restless 0 0 0  Suicidal thoughts 0 0 0  PHQ-9 Score 8 2   Difficult doing work/chores Somewhat difficult Not difficult at all Not difficult at all      07/30/2023   10:38 AM 07/30/2023   10:33 AM 12/05/2021    3:48 PM 09/11/2021    4:25 PM  GAD 7 : Generalized Anxiety Score  Nervous, Anxious, on Edge 1 0 0 0  Control/stop worrying 0 0 0 0  Worry too much - different things 1 0 0 0  Trouble relaxing 2 0 1 0   Restless 0 0 0 0  Easily annoyed or irritable 1 0 0 1  Afraid - awful might happen 0 0 0 0  Total GAD 7 Score 5 0 1 1  Anxiety Difficulty Somewhat difficult  Not difficult at all Not difficult at all   Lab Results  Component Value Date   HGBA1C 7.6 (H) 07/30/2023     Assessment/ Plan: Diania Barrack here for annual physical exam.   Annual physical exam  Diabetes mellitus treated with injections of non-insulin medication (HCC) - Plan: Bayer DCA Hb A1c Waived, CBC, CMP14+EGFR, glipiZIDE  (GLUCOTROL  XL) 10 MG 24 hr tablet  Hyperlipidemia associated with type 2 diabetes mellitus (HCC) - Plan: Lipid Panel, TSH, CMP14+EGFR  Hypertension associated with diabetes (HCC) - Plan: CMP14+EGFR  Chronic right shoulder pain - Plan: DG Cervical Spine 2 or 3 views, Ambulatory referral to Orthopedic Surgery  Sleep disorder - Plan: traZODone  (DESYREL ) 50 MG tablet, Ambulatory referral to Sleep Studies  Declined Pap smear today.  Wish to defer this.  She declined vaccines today.    A1c demonstrates that she is out of range for sugar control.  It was 7.6 today.  Is on max dose of metformin  and Ozempic .  I have maxed out her glipizide  extended release to 10 mg today.  I would like to see her back again in 3 months and we will repeat labs and perform Pap smear  She will continue statin.  Check nonfasting lipid, liver function tests  Repeat blood pressure was acceptable.  No changes needed.  Continue Dyazide as prescribed  I have obtained plain films of her C-spine as her symptomology sounds radicular in nature.  I referred her to orthopedics as well.  Trazodone  trial for sleep disorder but I do question if perhaps she is having OSA/OHS symptoms.  Referral for home sleep study placed.  Counseled on healthy lifestyle choices, including diet (rich in fruits, vegetables and lean meats and low in salt and simple carbohydrates) and exercise (at least 30 minutes of moderate physical activity  daily).  Patient to follow up 49m  Sosaia Pittinger M. Jolinda, DO

## 2023-07-31 LAB — CMP14+EGFR
ALT: 10 [IU]/L (ref 0–32)
AST: 15 [IU]/L (ref 0–40)
Albumin: 4.2 g/dL (ref 3.9–4.9)
Alkaline Phosphatase: 91 [IU]/L (ref 44–121)
BUN/Creatinine Ratio: 14 (ref 9–23)
BUN: 11 mg/dL (ref 6–24)
Bilirubin Total: 0.3 mg/dL (ref 0.0–1.2)
CO2: 21 mmol/L (ref 20–29)
Calcium: 9.1 mg/dL (ref 8.7–10.2)
Chloride: 103 mmol/L (ref 96–106)
Creatinine, Ser: 0.79 mg/dL (ref 0.57–1.00)
Globulin, Total: 2.7 g/dL (ref 1.5–4.5)
Glucose: 151 mg/dL — ABNORMAL HIGH (ref 70–99)
Potassium: 3.7 mmol/L (ref 3.5–5.2)
Sodium: 140 mmol/L (ref 134–144)
Total Protein: 6.9 g/dL (ref 6.0–8.5)
eGFR: 96 mL/min/{1.73_m2} (ref >59)

## 2023-07-31 LAB — CBC
Hematocrit: 39 % (ref 34.0–46.6)
Hemoglobin: 13.1 g/dL (ref 11.1–15.9)
MCH: 32 pg (ref 26.6–33.0)
MCHC: 33.6 g/dL (ref 31.5–35.7)
MCV: 95 fL (ref 79–97)
Platelets: 294 10*3/uL (ref 150–450)
RBC: 4.1 x10E6/uL (ref 3.77–5.28)
RDW: 12.6 % (ref 11.7–15.4)
WBC: 5.4 10*3/uL (ref 3.4–10.8)

## 2023-07-31 LAB — LIPID PANEL
Chol/HDL Ratio: 3.9 {ratio} (ref 0.0–4.4)
Cholesterol, Total: 190 mg/dL (ref 100–199)
HDL: 49 mg/dL (ref >39)
LDL Chol Calc (NIH): 121 mg/dL — ABNORMAL HIGH (ref 0–99)
Triglycerides: 109 mg/dL (ref 0–149)
VLDL Cholesterol Cal: 20 mg/dL (ref 5–40)

## 2023-07-31 LAB — TSH: TSH: 1.28 u[IU]/mL (ref 0.450–4.500)

## 2023-11-06 ENCOUNTER — Other Ambulatory Visit (HOSPITAL_COMMUNITY)
Admission: RE | Admit: 2023-11-06 | Discharge: 2023-11-06 | Disposition: A | Discharge status: Home / Self Care | Source: Ambulatory Visit | Attending: Family Medicine | Admitting: Family Medicine

## 2023-11-06 ENCOUNTER — Ambulatory Visit: Payer: Managed Care, Other (non HMO) | Admitting: Family Medicine

## 2023-11-06 ENCOUNTER — Encounter: Payer: Self-pay | Admitting: Family Medicine

## 2023-11-06 VITALS — BP 120/86 | HR 88 | Temp 98.4°F | Ht 63.0 in | Wt 196.0 lb

## 2023-11-06 DIAGNOSIS — Z124 Encounter for screening for malignant neoplasm of cervix: Secondary | ICD-10-CM | POA: Diagnosis present

## 2023-11-06 DIAGNOSIS — E1159 Type 2 diabetes mellitus with other circulatory complications: Secondary | ICD-10-CM | POA: Diagnosis not present

## 2023-11-06 DIAGNOSIS — E1169 Type 2 diabetes mellitus with other specified complication: Secondary | ICD-10-CM | POA: Diagnosis not present

## 2023-11-06 DIAGNOSIS — E785 Hyperlipidemia, unspecified: Secondary | ICD-10-CM

## 2023-11-06 DIAGNOSIS — F419 Anxiety disorder, unspecified: Secondary | ICD-10-CM

## 2023-11-06 DIAGNOSIS — E119 Type 2 diabetes mellitus without complications: Secondary | ICD-10-CM | POA: Diagnosis not present

## 2023-11-06 DIAGNOSIS — I152 Hypertension secondary to endocrine disorders: Secondary | ICD-10-CM

## 2023-11-06 DIAGNOSIS — Z1159 Encounter for screening for other viral diseases: Secondary | ICD-10-CM

## 2023-11-06 DIAGNOSIS — Z7985 Long-term (current) use of injectable non-insulin antidiabetic drugs: Secondary | ICD-10-CM

## 2023-11-06 DIAGNOSIS — Z114 Encounter for screening for human immunodeficiency virus [HIV]: Secondary | ICD-10-CM

## 2023-11-06 MED ORDER — ROSUVASTATIN CALCIUM 5 MG PO TABS: 5.0000 mg | ORAL_TABLET | Freq: Every day | ORAL | 3 refills | Status: AC

## 2023-11-06 MED ORDER — TRIAMTERENE-HCTZ 37.5-25 MG PO CAPS: 1.0000 | ORAL_CAPSULE | Freq: Every day | ORAL | 3 refills | Status: AC

## 2023-11-06 MED ORDER — SYNJARDY 12.5-1000 MG PO TABS: 1.0000 | ORAL_TABLET | Freq: Two times a day (BID) | ORAL | 3 refills | Status: AC

## 2023-11-06 MED ORDER — SEMAGLUTIDE (2 MG/DOSE) 8 MG/3ML ~~LOC~~ SOPN: 2.0000 mg | PEN_INJECTOR | SUBCUTANEOUS | 3 refills | Status: AC

## 2023-11-06 MED ORDER — BUSPIRONE HCL 5 MG PO TABS
ORAL_TABLET | ORAL | 0 refills | Status: AC
Start: 2023-11-06 — End: 2024-02-04

## 2023-11-06 NOTE — Patient Instructions (Addendum)
 Buspar  sent. May increase by 1/2 tablet each week if needed. See taper instructions.  Let me know what dose works for you if you need a refill before our next visit Don't forget to activate your coupon and give to pharmacy so the copay will be less for the new combo metformin /Jardiance pill. Don't forget the bathroom tips we discussed. For now, COMBINE the metformin  with 1 tablet ONLY per day of the Jardiance. Ex: take 1 metformin  with 1 jardiance each morning, and ONLY the metformin  in the evening.

## 2023-11-06 NOTE — Progress Notes (Signed)
 Subjective: CC:DM PCP: Eliodoro Guerin, DO [id number] Witthuhn is a 43 y.o. female presenting to clinic today for:  1. Type 2 Diabetes with hypertension, hyperlipidemia:  She is compliant with her glipizide , metformin  and semaglutide .  She is on max doses of all of these meds.  Compliant rosuvastatin , Dyazide.  She reports blood sugars have been typically running around 130s.  No hypoglycemic episodes.  Diabetes Health Maintenance Due  Topic Date Due   FOOT EXAM  12/28/2023   HEMOGLOBIN A1C  01/27/2024   OPHTHALMOLOGY EXAM  06/02/2024    Last A1c:  Lab Results  Component Value Date   HGBA1C 7.6 (H) 07/30/2023    ROS: Denies dizziness, LOC, polyuria, polydipsia, unintended weight loss/gain, foot ulcerations, numbness or tingling in extremities, shortness of breath or chest pain.  2.  Anxiety She continues to suffer from anxiety.  She notes recently when she was at an [place] function she really was having some symptoms and wanted just to escape the event, which is very atypical of her.  She denies any depressive symptoms.  Still has some trouble with waking up in the melanite but overall trazodone  is helping with sleep.  Currently taking 1.5 tabs at nighttime  ROS: Per HPI  No Known Allergies Past Medical History:  Diagnosis Date   Allergy    Diabetes mellitus without complication (HCC)    Hypertension    Trichimoniasis 01/06/2018   Treated 6/24, POC ____    Current Outpatient Medications:    fluticasone  (FLONASE ) 50 MCG/ACT nasal spray, Place 2 sprays into both nostrils daily., Disp: 48 g, Rfl: 3   glipiZIDE  (GLUCOTROL  XL) 10 MG 24 hr tablet, Take 1 tablet (10 mg total) by mouth daily with breakfast. **dose change, Disp: 90 tablet, Rfl: 3   metFORMIN  (GLUCOPHAGE ) 1000 MG tablet, Take 1 tablet (1,000 mg total) by mouth 2 (two) times daily with a meal., Disp: 180 tablet, Rfl: 3   rosuvastatin  (CRESTOR ) 5 MG tablet, Take 1 tablet (5 mg total) by mouth daily., Disp: 90  tablet, Rfl: 3   Semaglutide , 2 MG/DOSE, 8 MG/3ML SOPN, Inject 2 mg as directed once a week., Disp: 9 mL, Rfl: 3   traZODone  (DESYREL ) 50 MG tablet, Take 0.5-2 tablets (25-100 mg total) by mouth at bedtime as needed for sleep., Disp: 30 tablet, Rfl: 1   triamterene -hydrochlorothiazide (DYAZIDE) 37.5-25 MG capsule, Take 1 each (1 capsule total) by mouth daily., Disp: 90 capsule, Rfl: 3 Social History   Socioeconomic History   Marital status: Single    Spouse name: Not on file   Number of children: Not on file   Years of education: Not on file   Highest education level: Not on file  Occupational History   Not on file  Tobacco Use   Smoking status: Never   Smokeless tobacco: Never  Vaping Use   Vaping status: Never Used  Substance and Sexual Activity   Alcohol use: Yes    Comment: social    Drug use: Never   Sexual activity: Yes    Birth control/protection: None  Other Topics Concern   Not on file  Social History Narrative   Not on file   Social Drivers of Health   Financial Resource Strain: Low Risk  (07/30/2023)   Overall Financial Resource Strain (CARDIA)    Difficulty of Paying Living Expenses: Not very hard  Food Insecurity: No Food Insecurity (07/30/2023)   Hunger Vital Sign    Worried About Running Out of Food in the  Last Year: Never true    Ran Out of Food in the Last Year: Never true  Transportation Needs: No Transportation Needs (07/30/2023)   PRAPARE - Administrator, Civil Service (Medical): No    Lack of Transportation (Non-Medical): No  Physical Activity: Inactive (07/30/2023)   Exercise Vital Sign    Days of Exercise per Week: 0 days    Minutes of Exercise per Session: 30 min  Stress: No Stress Concern Present (07/30/2023)   [employer] of Occupational Health - Occupational Stress Questionnaire    Feeling of Stress : Not at all  Social Connections: Moderately Integrated (07/30/2023)   Social Connection and Isolation Panel [NHANES]     Frequency of Communication with Friends and Family: More than three times a week    Frequency of Social Gatherings with Friends and Family: More than three times a week    Attends Religious Services: 1 to 4 times per year    Active Member of [employer] or Organizations: No    Attends Banker Meetings: Never    Marital Status: Married  Catering manager Violence: Not At Risk (07/30/2023)   Humiliation, Afraid, Rape, and Kick questionnaire    Fear of Current or Ex-Partner: No    Emotionally Abused: No    Physically Abused: No    Sexually Abused: No   Family History  Problem Relation Age of Onset   Diabetes Father    Lung cancer Maternal Grandmother    Pneumonia Maternal Grandfather    Pneumonia Paternal Grandfather    Breast cancer Neg Hx     Objective: Office vital signs reviewed. BP 120/86   Pulse 88   Temp 98.4 F (36.9 C)   Ht 5\' 3"  (1.6 m)   Wt 196 lb (88.9 kg)   LMP 10/29/2023   SpO2 100%   BMI 34.72 kg/m   Physical Examination:  General: Awake, alert, well nourished, No acute distress HEENT: sclera white, MMM Cardio: regular rate and rhythm, S1S2 heard, no murmurs appreciated Pulm: clear to auscultation bilaterally, no wheezes, rhonchi or rales; normal work of breathing on room air GU: external vaginal tissue normal, cervix high and midline, no punctate lesions on cervix appreciated, moderate mucus discharge from cervical os, no  bleeding, no cervical motion tenderness, no abdominal/ adnexal masses      11/06/2023    3:35 PM 07/30/2023   10:34 AM 12/28/2022    3:35 PM  Depression screen PHQ 2/9  Decreased Interest 2 1 0  Down, Depressed, Hopeless 1 1 0  PHQ - 2 Score 3 2 0  Altered sleeping 2 3 1   Tired, decreased energy 2 1 0  Change in appetite 0 0 1  Feeling bad or failure about yourself  0 0 0  Trouble concentrating 1 2 0  Moving slowly or fidgety/restless 0 0 0  Suicidal thoughts 0 0 0  PHQ-9 Score 8 8 2   Difficult doing work/chores Somewhat  difficult Somewhat difficult Not difficult at all      11/06/2023    3:37 PM 07/30/2023   10:38 AM 07/30/2023   10:33 AM 12/05/2021    3:48 PM  GAD 7 : Generalized Anxiety Score  Nervous, Anxious, on Edge 2 1 0 0  Control/stop worrying 0 0 0 0  Worry too much - different things 0 1 0 0  Trouble relaxing 2 2 0 1  Restless 1 0 0 0  Easily annoyed or irritable 2 1 0 0  Afraid -  awful might happen 0 0 0 0  Total GAD 7 Score 7 5 0 1  Anxiety Difficulty Somewhat difficult Somewhat difficult  Not difficult at all    Assessment/ Plan: 43 y.o. female   Diabetes mellitus treated with injections of non-insulin medication (HCC) - Plan: Bayer DCA Hb A1c Waived, Empagliflozin-metFORMIN  HCl (SYNJARDY ) 12.11-998 MG TABS, Semaglutide , 2 MG/DOSE, 8 MG/3ML SOPN  Hypertension associated with diabetes (HCC) - Plan: triamterene -hydrochlorothiazide (DYAZIDE) 37.5-25 MG capsule  Hyperlipidemia associated with type 2 diabetes mellitus (HCC) - Plan: rosuvastatin  (CRESTOR ) 5 MG tablet  Screening for malignant neoplasm of cervix - Plan: Cytology - PAP, CANCELED: Cytology - PAP  Need for hepatitis C screening test - Plan: Hepatitis C antibody  Encounter for screening for HIV - Plan: HIV Antibody (routine testing w rflx)  Anxiety - Plan: busPIRone  (BUSPAR ) 5 MG tablet  I am adding Jardiance to her regimen and I have given her 4 weeks of samples of Jardiance to use with her metformin .  I sent over Synjardy  to replace her metformin  going forward and I have alerted her of this.  She was given a coupon card to activate.  She will continue all other medications as prescribed.  Caution possible vaginitis with medication and encouraged her to practice good hygiene after urination  I have renewed her blood pressure medicine.  BP was controlled.  Continue statin.  Not due for fasting lipid  Pap completed and screening hepatitis C and HIV collected.  No known exposures  Start BuSpar  for anxiety.  Discussed  titration.  Handout also provided.  Follow-up in 3 months  Maryam Feely Bambi Bonine, DO [facility] [phone]

## 2023-11-07 LAB — BAYER DCA HB A1C WAIVED: HB A1C (BAYER DCA - WAIVED): 8.3 % — ABNORMAL HIGH (ref 4.8–5.6)

## 2023-11-07 LAB — HIV ANTIBODY (ROUTINE TESTING W REFLEX): HIV Screen 4th Generation wRfx: NONREACTIVE

## 2023-11-07 LAB — HEPATITIS C ANTIBODY: Hep C Virus Ab: NONREACTIVE

## 2023-11-08 ENCOUNTER — Encounter: Payer: Self-pay | Admitting: Family Medicine

## 2023-11-08 LAB — CYTOLOGY - PAP
Comment: NEGATIVE
Diagnosis: NEGATIVE
High risk HPV: NEGATIVE

## 2023-11-12 ENCOUNTER — Telehealth: Payer: Self-pay

## 2023-11-12 ENCOUNTER — Other Ambulatory Visit (HOSPITAL_COMMUNITY): Payer: Self-pay

## 2023-11-12 NOTE — Telephone Encounter (Signed)
 Pharmacy Patient Advocate Encounter   Received notification from Onbase that prior authorization for Synjardy  is required/requested.   Insurance verification completed.   The patient is insured through [employer] .   Per test claim: Refill too soon. PA is not needed at this time. Medication was filled 11/06/2023. Next eligible fill date is 01/12/2024.

## 2023-11-19 ENCOUNTER — Other Ambulatory Visit (HOSPITAL_COMMUNITY): Payer: Self-pay

## 2024-06-25 ENCOUNTER — Other Ambulatory Visit (HOSPITAL_COMMUNITY): Payer: Self-pay
